# Patient Record
Sex: Male | Born: 1960 | Race: White | Hispanic: No
Health system: Southern US, Community
[De-identification: ages and names within clinical notes are randomized; demographics above are authoritative.]

## PROBLEM LIST (undated history)

## (undated) DIAGNOSIS — G4700 Insomnia, unspecified: Secondary | ICD-10-CM

## (undated) DIAGNOSIS — F32A Depression, unspecified: Secondary | ICD-10-CM

## (undated) DIAGNOSIS — M1990 Unspecified osteoarthritis, unspecified site: Secondary | ICD-10-CM

## (undated) DIAGNOSIS — E119 Type 2 diabetes mellitus without complications: Secondary | ICD-10-CM

## (undated) DIAGNOSIS — K219 Gastro-esophageal reflux disease without esophagitis: Secondary | ICD-10-CM

## (undated) DIAGNOSIS — F259 Schizoaffective disorder, unspecified: Secondary | ICD-10-CM

## (undated) DIAGNOSIS — I8393 Asymptomatic varicose veins of bilateral lower extremities: Secondary | ICD-10-CM

## (undated) DIAGNOSIS — R471 Dysarthria and anarthria: Secondary | ICD-10-CM

## (undated) DIAGNOSIS — M545 Low back pain, unspecified: Secondary | ICD-10-CM

## (undated) DIAGNOSIS — M549 Dorsalgia, unspecified: Secondary | ICD-10-CM

## (undated) DIAGNOSIS — K429 Umbilical hernia without obstruction or gangrene: Secondary | ICD-10-CM

## (undated) DIAGNOSIS — F319 Bipolar disorder, unspecified: Secondary | ICD-10-CM

## (undated) HISTORY — DX: Asymptomatic varicose veins of bilateral lower extremities: I83.93

## (undated) HISTORY — DX: Insomnia, unspecified: G47.00

## (undated) HISTORY — PX: WRIST SURGERY: SHX841

## (undated) HISTORY — DX: Type 2 diabetes mellitus without complications: E11.9

## (undated) HISTORY — DX: Dorsalgia, unspecified: M54.9

## (undated) HISTORY — PX: ANKLE SURGERY: SHX546

## (undated) HISTORY — DX: Gastro-esophageal reflux disease without esophagitis: K21.9

## (undated) HISTORY — DX: Unspecified osteoarthritis, unspecified site: M19.90

## (undated) HISTORY — PX: ABDOMINAL SURGERY: SHX537

## (undated) HISTORY — DX: Bipolar disorder, unspecified: F31.9

## (undated) HISTORY — DX: Depression, unspecified: F32.A

## (undated) HISTORY — DX: Dysarthria and anarthria: R47.1

## (undated) HISTORY — DX: Schizoaffective disorder, unspecified: F25.9

## (undated) HISTORY — DX: Umbilical hernia without obstruction or gangrene: K42.9

## (undated) HISTORY — DX: Low back pain, unspecified: M54.50

---

## 2020-08-04 ENCOUNTER — Telehealth: Payer: Self-pay

## 2020-08-04 NOTE — Telephone Encounter (Signed)
Pt scheduled for NP appt w/ Dr. Orvan Falconer 08/08/20. Records received from [facility] incomplete. Appears records received were as follows:   Order for FOBT and GI referral  POC results for FOBT = POS  05/11/20 CBC and incomplete BMP  Called [facility] and spoke with nurse caring for pt. Advised additional records will be required as listed below:   Current MAR  Updated and complete labs relevant to appt  Imaging if completed   Notes relevant to appt  Misc records nurse feels would be relevant to appt  Provided fax number. States pt has been having ongoing weight loss unrelated to failure to thrive. States she will ensure we have records needed for appt to address pt ongoing medical concerns.

## 2020-08-08 ENCOUNTER — Encounter: Payer: Self-pay | Admitting: Gastroenterology

## 2020-08-08 ENCOUNTER — Ambulatory Visit (INDEPENDENT_AMBULATORY_CARE_PROVIDER_SITE_OTHER): Payer: Medicaid Other | Admitting: Gastroenterology

## 2020-08-08 VITALS — BP 110/70 | HR 60 | Ht 66.54 in | Wt 185.1 lb

## 2020-08-08 DIAGNOSIS — R634 Abnormal weight loss: Secondary | ICD-10-CM

## 2020-08-08 DIAGNOSIS — K921 Melena: Secondary | ICD-10-CM | POA: Diagnosis not present

## 2020-08-08 NOTE — Progress Notes (Signed)
Referring Provider: Galvin Proffer, MD Primary Care Physician:  Galvin Proffer, MD  Reason for Consultation:  Blood in the stool   IMPRESSION:  Hemoccult positive stools Normocytic anemia Unintentional weight loss of 100 pounds Reflux  Endoscopy evaluation recommended to evaluate for GI blood loss anemia without overt bleeding or localizing symptoms. He is currently refusing any endoscopic evaluation. We discussed the risks of missed or undiagnosed lesions.  PLAN: EGD and colonoscopy can be scheduled at his convenience  Please see the "Patient Instructions" section for addition details about the plan.  HPI: Connor Franklin is a 59 y.o. male referred by Dr. Farrel Conners for further evaluation of Hemoccult positive stools and a 100 pound weight loss over the last year. The history is obtained through the patient and review of his electronic health record. He has dysarthria, GERD, major depressive disorder, type 2 diabetes, insomnia, schizophrenia, arthritis.   Referral records note Hemoccult positive stools 05/12/2020, CBC showed a hemoglobin of 11.4, MCV 97, RDW 12.1, platelets 414  Smokes 3 cigarettes daily. No alcohol. No street drugs.   Occasional diarrhea. Possible food triggers. GI ROS is otherwise negative.   Prior colonoscopy at [facility] at age 4 with removal of "benign" polyps.   No known family history of colon cancer or polyps. No family history of uterine/endometrial cancer, pancreatic cancer or gastric/stomach cancer.   Past Medical History:  Diagnosis Date  . Arthritis   . Bipolar disorder (HCC)   . Depression   . DM (diabetes mellitus) (HCC)   . Dorsalgia   . Dysarthria and anarthria   . GERD (gastroesophageal reflux disease)   . Hernia, umbilical   . Insomnia   . Low back pain   . Schizoaffective disorder (HCC)   . Varicose veins of both lower extremities     Past Surgical History:  Procedure Laterality Date  . ABDOMINAL SURGERY    . ANKLE  SURGERY Right   . WRIST SURGERY Right     Current Outpatient Medications  Medication Sig Dispense Refill  . acetaminophen (TYLENOL) 500 MG tablet Take 1,000 mg by mouth in the morning, at noon, and at bedtime.    . carvedilol (COREG) 12.5 MG tablet Take 12.5 mg by mouth 2 (two) times daily with a meal.    . clonazePAM (KLONOPIN) 1 MG tablet Take 1 mg by mouth at bedtime.    . divalproex (DEPAKOTE) 250 MG DR tablet Take 750 mg by mouth at bedtime.    . docusate sodium (COLACE) 100 MG capsule Take 200 mg by mouth at bedtime.    . finasteride (PROSCAR) 5 MG tablet Take 5 mg by mouth daily.    . haloperidol (HALDOL) 1 MG tablet Take 3 mg by mouth 2 (two) times daily. T 2 pm and at bedtime    . lactulose (CHRONULAC) 10 GM/15ML solution Take 20 g by mouth daily.    . melatonin 5 MG TABS Take 5 mg by mouth at bedtime.    . nitroGLYCERIN (NITROSTAT) 0.4 MG SL tablet Place 0.4 mg under the tongue every 5 (five) minutes as needed for chest pain.    Marland Kitchen risperiDONE (RISPERDAL) 2 MG tablet Take 2 mg by mouth in the morning.    . sodium chloride 1 g tablet Take 1 g by mouth 4 (four) times daily.    . tamsulosin (FLOMAX) 0.4 MG CAPS capsule Take 0.4 mg by mouth in the morning.    . traZODone (DESYREL) 150 MG tablet Take 150  mg by mouth at bedtime.     No current facility-administered medications for this visit.    Allergies as of 08/08/2020 - Review Complete 08/08/2020  Allergen Reaction Noted  . Elavil [amitriptyline hcl]  08/08/2020  . Sulfa antibiotics  08/08/2020    Family History  Family history unknown: Yes    Social History   Socioeconomic History  . Marital status: Unknown    Spouse name: Not on file  . Number of children: 0  . Years of education: Not on file  . Highest education level: Not on file  Occupational History  . Occupation: disabled  Tobacco Use  . Smoking status: Current Every Day Smoker  . Smokeless tobacco: Former Neurosurgeon    Types: Engineer, drilling  . Vaping Use:  Never used  Substance and Sexual Activity  . Alcohol use: Not Currently  . Drug use: Never  . Sexual activity: Not on file  Other Topics Concern  . Not on file  Social History Narrative  . Not on file   Social Determinants of Health   Financial Resource Strain: Not on file  Food Insecurity: Not on file  Transportation Needs: Not on file  Physical Activity: Not on file  Stress: Not on file  Social Connections: Not on file  Intimate Partner Violence: Not on file    Review of Systems: 12 system ROS is negative except as noted above. "He mentioned that he just wants to die."   Physical Exam: General:   Alert,  well-nourished, pleasant and cooperative in NAD Head:  Normocephalic and atraumatic. Eyes:  Sclera clear, no icterus.   Conjunctiva pink. Ears:  Normal auditory acuity. Nose:  No deformity, discharge,  or lesions. Mouth:  No deformity or lesions.   Neck:  Supple; no masses or thyromegaly. Lungs:  Clear throughout to auscultation.   No wheezes. Heart:  Regular rate and rhythm; no murmurs. Abdomen:  Soft, large midline incision with a reducible umbilical hernia, extensive panus, nontender, nondistended, normal bowel sounds, no rebound or guarding. No hepatosplenomegaly.   Rectal:  Deferred  Msk:  Symmetrical. No boney deformities LAD: No inguinal or umbilical LAD Extremities:  No clubbing or edema. Neurologic:  Alert and  oriented x4;  grossly nonfocal Skin:  Intact without significant lesions or rashes. Psych:  Alert and cooperative. Normal mood and affect.    Yavonne Kiss L. Orvan Falconer, MD, MPH 08/12/2020, 9:26 PM

## 2020-08-08 NOTE — Patient Instructions (Addendum)
PROCEDURE:  It has been recommended to you by your physician that you have an Endoscopy and Colonoscopy completed. Per your request, we did not schedule the procedure(s) today. Please contact our office at [phone] should you decide to have the procedure completed. You will be scheduled for a pre-visit and procedure at that time.  I have recommended a colonoscopy and upper endoscopy to further evaluate your unintentional weight loss, blood in the stool, and anemia (low blood counts). I know you don't want to have the procedures done now. But, I hope you will reconsider. Please call me at any time to schedule the procedures.   If you are age 61 or younger, your body mass index should be between 19-25. Your There is no height or weight on file to calculate BMI. If this is out of the aformentioned range listed, please consider follow up with your Primary Care Provider.   Thank you for trusting me with your gastrointestinal care!    Tressia Danas, MD, MPH

## 2020-09-02 ENCOUNTER — Emergency Department (HOSPITAL_COMMUNITY): Payer: Medicaid Other

## 2020-09-02 ENCOUNTER — Encounter (HOSPITAL_COMMUNITY): Payer: Self-pay | Admitting: Emergency Medicine

## 2020-09-02 ENCOUNTER — Other Ambulatory Visit: Payer: Self-pay

## 2020-09-02 ENCOUNTER — Inpatient Hospital Stay (HOSPITAL_COMMUNITY)
Admission: EM | Admit: 2020-09-02 | Discharge: 2020-09-08 | DRG: 641 | Disposition: A | Discharge status: Skilled Nursing Facility | Payer: Medicaid Other | Source: Skilled Nursing Facility | Attending: Internal Medicine | Admitting: Internal Medicine

## 2020-09-02 DIAGNOSIS — E871 Hypo-osmolality and hyponatremia: Secondary | ICD-10-CM | POA: Diagnosis not present

## 2020-09-02 DIAGNOSIS — F32A Depression, unspecified: Secondary | ICD-10-CM | POA: Diagnosis present

## 2020-09-02 DIAGNOSIS — F17210 Nicotine dependence, cigarettes, uncomplicated: Secondary | ICD-10-CM | POA: Diagnosis present

## 2020-09-02 DIAGNOSIS — Z20822 Contact with and (suspected) exposure to covid-19: Secondary | ICD-10-CM | POA: Diagnosis present

## 2020-09-02 DIAGNOSIS — Z882 Allergy status to sulfonamides status: Secondary | ICD-10-CM

## 2020-09-02 DIAGNOSIS — R0602 Shortness of breath: Secondary | ICD-10-CM

## 2020-09-02 DIAGNOSIS — I10 Essential (primary) hypertension: Secondary | ICD-10-CM | POA: Diagnosis present

## 2020-09-02 DIAGNOSIS — E119 Type 2 diabetes mellitus without complications: Secondary | ICD-10-CM | POA: Diagnosis present

## 2020-09-02 DIAGNOSIS — Z79899 Other long term (current) drug therapy: Secondary | ICD-10-CM

## 2020-09-02 DIAGNOSIS — E869 Volume depletion, unspecified: Secondary | ICD-10-CM | POA: Diagnosis present

## 2020-09-02 DIAGNOSIS — Z66 Do not resuscitate: Secondary | ICD-10-CM | POA: Diagnosis present

## 2020-09-02 DIAGNOSIS — R631 Polydipsia: Secondary | ICD-10-CM | POA: Diagnosis present

## 2020-09-02 DIAGNOSIS — N400 Enlarged prostate without lower urinary tract symptoms: Secondary | ICD-10-CM | POA: Diagnosis present

## 2020-09-02 DIAGNOSIS — G4700 Insomnia, unspecified: Secondary | ICD-10-CM | POA: Diagnosis present

## 2020-09-02 DIAGNOSIS — E2749 Other adrenocortical insufficiency: Secondary | ICD-10-CM | POA: Diagnosis present

## 2020-09-02 DIAGNOSIS — K219 Gastro-esophageal reflux disease without esophagitis: Secondary | ICD-10-CM | POA: Diagnosis present

## 2020-09-02 DIAGNOSIS — F259 Schizoaffective disorder, unspecified: Secondary | ICD-10-CM | POA: Clinically undetermined

## 2020-09-02 DIAGNOSIS — Z888 Allergy status to other drugs, medicaments and biological substances status: Secondary | ICD-10-CM

## 2020-09-02 LAB — RAPID URINE DRUG SCREEN, HOSP PERFORMED
Amphetamines: NOT DETECTED
Barbiturates: NOT DETECTED
Benzodiazepines: NOT DETECTED
Cocaine: NOT DETECTED
Opiates: NOT DETECTED
Tetrahydrocannabinol: NOT DETECTED

## 2020-09-02 LAB — URINALYSIS, ROUTINE W REFLEX MICROSCOPIC
Bacteria, UA: NONE SEEN
Bilirubin Urine: NEGATIVE
Glucose, UA: 50 mg/dL — AB
Ketones, ur: NEGATIVE mg/dL
Leukocytes,Ua: NEGATIVE
Nitrite: NEGATIVE
Protein, ur: NEGATIVE mg/dL
Specific Gravity, Urine: 1.002 — ABNORMAL LOW (ref 1.005–1.030)
pH: 7 (ref 5.0–8.0)

## 2020-09-02 LAB — CBC WITH DIFFERENTIAL/PLATELET
Abs Immature Granulocytes: 0.08 10*3/uL — ABNORMAL HIGH (ref 0.00–0.07)
Basophils Absolute: 0.1 10*3/uL (ref 0.0–0.1)
Basophils Relative: 1 %
Eosinophils Absolute: 0.1 10*3/uL (ref 0.0–0.5)
Eosinophils Relative: 1 %
HCT: 32.3 % — ABNORMAL LOW (ref 39.0–52.0)
Hemoglobin: 11.5 g/dL — ABNORMAL LOW (ref 13.0–17.0)
Immature Granulocytes: 1 %
Lymphocytes Relative: 23 %
Lymphs Abs: 2 10*3/uL (ref 0.7–4.0)
MCH: 34.6 pg — ABNORMAL HIGH (ref 26.0–34.0)
MCHC: 35.6 g/dL (ref 30.0–36.0)
MCV: 97.3 fL (ref 80.0–100.0)
Monocytes Absolute: 0.8 10*3/uL (ref 0.1–1.0)
Monocytes Relative: 9 %
Neutro Abs: 5.7 10*3/uL (ref 1.7–7.7)
Neutrophils Relative %: 65 %
Platelets: 387 10*3/uL (ref 150–400)
RBC: 3.32 MIL/uL — ABNORMAL LOW (ref 4.22–5.81)
RDW: 13.5 % (ref 11.5–15.5)
WBC: 8.8 10*3/uL (ref 4.0–10.5)
nRBC: 0 % (ref 0.0–0.2)

## 2020-09-02 LAB — COMPREHENSIVE METABOLIC PANEL
ALT: 18 U/L (ref 0–44)
AST: 25 U/L (ref 15–41)
Albumin: 4 g/dL (ref 3.5–5.0)
Alkaline Phosphatase: 90 U/L (ref 38–126)
Anion gap: 10 (ref 5–15)
BUN: 11 mg/dL (ref 6–20)
CO2: 23 mmol/L (ref 22–32)
Calcium: 9.9 mg/dL (ref 8.9–10.3)
Chloride: 80 mmol/L — ABNORMAL LOW (ref 98–111)
Creatinine, Ser: 0.92 mg/dL (ref 0.61–1.24)
GFR, Estimated: >60 mL/min (ref >60)
Glucose, Bld: 113 mg/dL — ABNORMAL HIGH (ref 70–99)
Potassium: 4.1 mmol/L (ref 3.5–5.1)
Sodium: 113 mmol/L — CL (ref 135–145)
Total Bilirubin: 1.1 mg/dL (ref 0.3–1.2)
Total Protein: 7 g/dL (ref 6.5–8.1)

## 2020-09-02 LAB — I-STAT CHEM 8, ED
BUN: 12 mg/dL (ref 6–20)
Calcium, Ion: 1.29 mmol/L (ref 1.15–1.40)
Chloride: 79 mmol/L — ABNORMAL LOW (ref 98–111)
Creatinine, Ser: 0.9 mg/dL (ref 0.61–1.24)
Glucose, Bld: 108 mg/dL — ABNORMAL HIGH (ref 70–99)
HCT: 37 % — ABNORMAL LOW (ref 39.0–52.0)
Hemoglobin: 12.6 g/dL — ABNORMAL LOW (ref 13.0–17.0)
Potassium: 4.3 mmol/L (ref 3.5–5.1)
Sodium: 112 mmol/L — CL (ref 135–145)
TCO2: 24 mmol/L (ref 22–32)

## 2020-09-02 LAB — RESP PANEL BY RT-PCR (FLU A&B, COVID) ARPGX2
Influenza A by PCR: NEGATIVE
Influenza B by PCR: NEGATIVE
SARS Coronavirus 2 by RT PCR: NEGATIVE

## 2020-09-02 LAB — TSH: TSH: 0.723 u[IU]/mL (ref 0.350–4.500)

## 2020-09-02 LAB — CREATININE, URINE, RANDOM: Creatinine, Urine: 8.55 mg/dL

## 2020-09-02 LAB — PROTIME-INR
INR: 1.1 (ref 0.8–1.2)
Prothrombin Time: 14 seconds (ref 11.4–15.2)

## 2020-09-02 LAB — SODIUM, URINE, RANDOM: Sodium, Ur: 40 mmol/L

## 2020-09-02 LAB — AMMONIA: Ammonia: 15 umol/L (ref 9–35)

## 2020-09-02 LAB — ETHANOL: Alcohol, Ethyl (B): <10 mg/dL (ref <10)

## 2020-09-02 LAB — CBG MONITORING, ED: Glucose-Capillary: 109 mg/dL — ABNORMAL HIGH (ref 70–99)

## 2020-09-02 LAB — VALPROIC ACID LEVEL: Valproic Acid Lvl: <10 ug/mL — ABNORMAL LOW (ref 50.0–100.0)

## 2020-09-02 MED ORDER — SODIUM CHLORIDE 0.9 % IV SOLN: Freq: Once | INTRAVENOUS | Status: AC

## 2020-09-02 MED ORDER — HALOPERIDOL LACTATE 5 MG/ML IJ SOLN
5.0000 mg | Freq: Once | INTRAMUSCULAR | Status: AC
  Administered 2020-09-02: 5 mg via INTRAVENOUS
  Filled 2020-09-02: qty 1

## 2020-09-02 NOTE — H&P (Signed)
NAME:  Connor Franklin, MRN:  [id number], DOB:  1960/12/21, LOS: 0 ADMISSION DATE:  09/02/2020, CONSULTATION DATE:  09/03/19 REFERRING MD: Criss Alvine, ED   , CHIEF COMPLAINT:  Altered mental satus   Brief History:  Agitation/ams  History of Present Illness:  Connor Franklin is a 60 yo man with a hx of bipolar disorder,  Schizoaffective disorder, depression, DM, GERD, sent to [facility] ED from [facility] with altered mental status.  He was reportedly altered today, running into walls, throwing self on ground, aggressive and agitated with staff.   Found to be hyponatremic to 112 in the ED today.   He was recently discharged from [facility] 2 weeks ago, and according to staff at his SNF, we was taken off his psychiatric meds at that time.   He was reportedly altered/agitated as well on 1/7 and seen at [facility] ED. By verbal report from staff, NA at hospital  was 114.  Possible history of hyponatremia in past, some verbal report that he had been taking salt tabs.  Review of recent hx shows sodium generally in 110s back to mid December, 120s-130s prior to that.    Past Medical History:  bipolar disorder,  Schizoaffective disorder, depression, DM, GERD, dyarthria  Home meds:  Coreg, lactulose, klonopin, depakote, risperdal, haldol, trazodone, flomax, proscar. melatonin, NTG  Significant Hospital Events:    Consults:    Procedures:    Significant Diagnostic Tests:  CT head IMPRESSION: 1. No acute intracranial abnormality. 2. Age related atrophy and chronic small vessel ischemia. Small remote left occipital infarct.  Micro Data:    Antimicrobials:    Interim History / Subjective:    Objective   Blood pressure (!) 162/93, pulse 73, temperature 97.9 F (36.6 C), temperature source Oral, resp. rate 15, SpO2 100 %.       No intake or output data in the 24 hours ending 09/02/20 2130 There were no vitals filed for this visit.  Examination: General: NAD,  sleeping, arousable, one word answers, tracks and makes eye contact HENT: Scar on chin.  MMM.  Adentulous Lungs: CTAB Cardiovascular: RRR no mgr Abdomen: NT, ND,NBS Extremities: eccymosis/chronic venous changes on BLE Neuro: Awake, sleepy.  Just received haldol.  Following some commands.  GU:   Resolved Hospital Problem list     Assessment & Plan:  Altered mental status: workup negative thus far aside from hyponatremia, which is mostly chronic.   Possibly due to recently stopping psychiatry medications?   Possibly 2/2 acute on chronic hyponatremia.  Exam appears non focal to me.  Ammonia negative.  B12 pending (hx of deficiency).   Hyponatremia:  Appears euvolemic.   U NA is 40.  FENA 3% Was given 75cc NS over one hour.  Was incontinent of urine initially, then 2L of dilute appearing urine after condom cath was placed.   Large volume urine suggests psychogenic polydipsia, perhaps complicated by low solute intake?  Consider renal losses, however no renal failure, no acidosis.  Mineralocorticoid deficiency.   Urine osm pending.  Discussed with nephrology, Dr. Valentino Nose.  Recheck Na q 2 hrs.  Hold further NS.   If corrects too fast on next check, will give DDAVP and free water.    Best practice (evaluated daily)  Diet: NPO for now Pain/Anxiety/Delirium protocol (if indicated): na  VAP protocol (if indicated):  DVT prophylaxis: lovenox GI prophylaxis:  Glucose control: iss  Mobility: bed Disposition:ICU  Goals of Care:  Last date of multidisciplinary goals of care  discussion:  Family and staff present:  Summary of discussion:  Follow up goals of care discussion due:  Code Status: DNR/DNI Patient unable to make decisions now but has a signed DNR/DNI form from 05/2020, signed by the patient and physician.  Discussed admission with his mother, Connor Franklin.  She is aware of his code status.  She had been visiting him earlier today when he was agitated and altered, and she was aware  that he was in the ED>   Labs   CBC: Recent Labs  Lab 09/02/20 2045 09/02/20 2058  WBC 8.8  --   NEUTROABS 5.7  --   HGB 11.5* 12.6*  HCT 32.3* 37.0*  MCV 97.3  --   PLT 387  --     Basic Metabolic Panel: Recent Labs  Lab 09/02/20 2058  NA 112*  K 4.3  CL 79*  GLUCOSE 108*  BUN 12  CREATININE 0.90   GFR: CrCl cannot be calculated (Unknown ideal weight.). Recent Labs  Lab 09/02/20 2045  WBC 8.8    Liver Function Tests: No results for input(s): AST, ALT, ALKPHOS, BILITOT, PROT, ALBUMIN in the last 168 hours. No results for input(s): LIPASE, AMYLASE in the last 168 hours. No results for input(s): AMMONIA in the last 168 hours.  ABG    Component Value Date/Time   TCO2 24 09/02/2020 2058     Coagulation Profile: No results for input(s): INR, PROTIME in the last 168 hours.  Cardiac Enzymes: No results for input(s): CKTOTAL, CKMB, CKMBINDEX, TROPONINI in the last 168 hours.  HbA1C: No results found for: HGBA1C  CBG: Recent Labs  Lab 09/02/20 2023  GLUCAP 109*    Review of Systems:   Unable to assess  Past Medical History:  He,  has a past medical history of Arthritis, Bipolar disorder (HCC), Depression, DM (diabetes mellitus) (HCC), Dorsalgia, Dysarthria and anarthria, GERD (gastroesophageal reflux disease), Hernia, umbilical, Insomnia, Low back pain, Schizoaffective disorder (HCC), and Varicose veins of both lower extremities.   Surgical History:   Past Surgical History:  Procedure Laterality Date  . ABDOMINAL SURGERY    . ANKLE SURGERY Right   . WRIST SURGERY Right      Social History:   reports that he has been smoking. He has quit using smokeless tobacco.  His smokeless tobacco use included chew. He reports previous alcohol use. He reports that he does not use drugs.   Family History:  His Family history is unknown by patient.   Allergies Allergies  Allergen Reactions  . Elavil [Amitriptyline Hcl]   . Sulfa Antibiotics      Home  Medications  Prior to Admission medications   Medication Sig Start Date End Date Taking? Authorizing Provider  acetaminophen (TYLENOL) 500 MG tablet Take 1,000 mg by mouth in the morning, at noon, and at bedtime.    [provider]  carvedilol (COREG) 12.5 MG tablet Take 12.5 mg by mouth 2 (two) times daily with a meal.    [provider]  clonazePAM (KLONOPIN) 1 MG tablet Take 1 mg by mouth at bedtime.    [provider]  divalproex (DEPAKOTE) 250 MG DR tablet Take 750 mg by mouth at bedtime.    [provider]  docusate sodium (COLACE) 100 MG capsule Take 200 mg by mouth at bedtime.    [provider]  finasteride (PROSCAR) 5 MG tablet Take 5 mg by mouth daily.    [provider]  haloperidol (HALDOL) 1 MG tablet Take 3 mg  by mouth 2 (two) times daily. T 2 pm and at bedtime    [provider]  lactulose (CHRONULAC) 10 GM/15ML solution Take 20 g by mouth daily.    [provider]  melatonin 5 MG TABS Take 5 mg by mouth at bedtime.    [provider]  nitroGLYCERIN (NITROSTAT) 0.4 MG SL tablet Place 0.4 mg under the tongue every 5 (five) minutes as needed for chest pain.    [provider]  risperiDONE (RISPERDAL) 2 MG tablet Take 2 mg by mouth in the morning.    [provider]  sodium chloride 1 g tablet Take 1 g by mouth 4 (four) times daily.    [provider]  tamsulosin (FLOMAX) 0.4 MG CAPS capsule Take 0.4 mg by mouth in the morning.    [provider]  traZODone (DESYREL) 150 MG tablet Take 150 mg by mouth at bedtime.    [provider]     Critical care time: 

## 2020-09-02 NOTE — ED Provider Notes (Signed)
[facility] Provider Note   CSN: [id number] Arrival date & time: 09/02/20  1945  LEVEL 5 CAVEAT - ALTERED MENTAL STATUS  History No chief complaint on file.   Connor Franklin is a 60 y.o. male.  HPI 60 year old male presents with altered mental status. History is from EMS and [facility].  Patient has reportedly been off his psychiatric meds for about 2 weeks.  Staff tells me that this is because when he was discharged from [facility] 2 weeks ago they took him off.  Patient was altered and agitated yesterday and went to [facility].  He was sent back to the facility.  He has a history of low sodiums before and the tech tells me that his sodium yesterday at the hospital was 114.  The patient has not had any fevers.  Today he was altered again and running himself into walls and throwing himself on the ground.  Was naked.  He also was becoming aggressive and attacking staff which is why EMS was called again.  Further history is very limited as the patient is altered.   Past Medical History:  Diagnosis Date   Arthritis    Bipolar disorder (HCC)    Depression    DM (diabetes mellitus) (HCC)    Dorsalgia    Dysarthria and anarthria    GERD (gastroesophageal reflux disease)    Hernia, umbilical    Insomnia    Low back pain    Schizoaffective disorder (HCC)    Varicose veins of both lower extremities     There are no problems to display for this patient.   Past Surgical History:  Procedure Laterality Date   ABDOMINAL SURGERY     ANKLE SURGERY Right    WRIST SURGERY Right        Family History  Family history unknown: Yes    Social History   Tobacco Use   Smoking status: Current Every Day Smoker   Smokeless tobacco: Former Neurosurgeon    Types: Associate Professor Use: Never used  Substance Use Topics   Alcohol use: Not Currently   Drug use: Never    Home Medications Prior to Admission  medications   Medication Sig Start Date End Date Taking? Authorizing Provider  acetaminophen (TYLENOL) 500 MG tablet Take 1,000 mg by mouth in the morning, at noon, and at bedtime.    [provider]  carvedilol (COREG) 12.5 MG tablet Take 12.5 mg by mouth 2 (two) times daily with a meal.    [provider]  clonazePAM (KLONOPIN) 1 MG tablet Take 1 mg by mouth at bedtime.    [provider]  divalproex (DEPAKOTE) 250 MG DR tablet Take 750 mg by mouth at bedtime.    [provider]  docusate sodium (COLACE) 100 MG capsule Take 200 mg by mouth at bedtime.    [provider]  finasteride (PROSCAR) 5 MG tablet Take 5 mg by mouth daily.    [provider]  haloperidol (HALDOL) 1 MG tablet Take 3 mg by mouth 2 (two) times daily. T 2 pm and at bedtime    [provider]  lactulose (CHRONULAC) 10 GM/15ML solution Take 20 g by mouth daily.    [provider]  melatonin 5 MG TABS Take 5 mg by mouth at bedtime.    [provider]  nitroGLYCERIN (NITROSTAT) 0.4 MG SL tablet Place 0.4 mg under the tongue every 5 (  five) minutes as needed for chest pain.    [provider]  risperiDONE (RISPERDAL) 2 MG tablet Take 2 mg by mouth in the morning.    [provider]  sodium chloride 1 g tablet Take 1 g by mouth 4 (four) times daily.    [provider]  tamsulosin (FLOMAX) 0.4 MG CAPS capsule Take 0.4 mg by mouth in the morning.    [provider]  traZODone (DESYREL) 150 MG tablet Take 150 mg by mouth at bedtime.    [provider]    Allergies    Elavil [amitriptyline hcl] and Sulfa antibiotics  Review of Systems   Review of Systems  Unable to perform ROS: Mental status change    Physical Exam Updated Vital Signs BP (!) 142/92    Pulse 82    Temp 97.9 F (36.6 C) (Oral)    Resp 16    SpO2 100%   Physical Exam Vitals and nursing note reviewed.  Constitutional:      Appearance:  He is well-developed and well-nourished.  HENT:     Head: Normocephalic and atraumatic.     Right Ear: External ear normal.     Left Ear: External ear normal.     Nose: Nose normal.     Mouth/Throat:     Mouth: Mucous membranes are dry.  Eyes:     General:        Right eye: No discharge.        Left eye: No discharge.     Pupils: Pupils are equal, round, and reactive to light.  Cardiovascular:     Rate and Rhythm: Normal rate and regular rhythm.     Heart sounds: Normal heart sounds.  Pulmonary:     Effort: Pulmonary effort is normal.     Breath sounds: Normal breath sounds.  Abdominal:     Palpations: Abdomen is soft.     Tenderness: There is no abdominal tenderness.     Hernia: A hernia (non-tender. no incarceration) is present.  Musculoskeletal:        General: No edema.     Cervical back: Neck supple.  Skin:    General: Skin is warm and dry.  Neurological:     Mental Status: He is alert.     Comments: Awake, alert. Thinks he's at [employer]. Other orientation questions aren't answered. He has equal strength in all 4 extremities  Psychiatric:        Mood and Affect: Mood is not anxious.     ED Results / Procedures / Treatments   Labs (all labs ordered are listed, but only abnormal results are displayed) Labs Reviewed  COMPREHENSIVE METABOLIC PANEL - Abnormal; Notable for the following components:      Result Value   Sodium 113 (*)    Chloride 80 (*)    Glucose, Bld 113 (*)    All other components within normal limits  CBC WITH DIFFERENTIAL/PLATELET - Abnormal; Notable for the following components:   RBC 3.32 (*)    Hemoglobin 11.5 (*)    HCT 32.3 (*)    MCH 34.6 (*)    Abs Immature Granulocytes 0.08 (*)    All other components within normal limits  URINALYSIS, ROUTINE W REFLEX MICROSCOPIC - Abnormal; Notable for the following components:   Color, Urine COLORLESS (*)    Specific Gravity, Urine 1.002 (*)    Glucose, UA 50 (*)    Hgb urine dipstick SMALL (*)     All other  components within normal limits  VALPROIC ACID LEVEL - Abnormal; Notable for the following components:   Valproic Acid Lvl <10 (*)    All other components within normal limits  CBG MONITORING, ED - Abnormal; Notable for the following components:   Glucose-Capillary 109 (*)    All other components within normal limits  I-STAT CHEM 8, ED - Abnormal; Notable for the following components:   Sodium 112 (*)    Chloride 79 (*)    Glucose, Bld 108 (*)    Hemoglobin 12.6 (*)    HCT 37.0 (*)    All other components within normal limits  RESP PANEL BY RT-PCR (FLU A&B, COVID) ARPGX2  ETHANOL  RAPID URINE DRUG SCREEN, HOSP PERFORMED  AMMONIA  PROTIME-INR  OSMOLALITY  OSMOLALITY, URINE  SODIUM, URINE, RANDOM  CORTISOL  TSH  CREATININE, URINE, RANDOM  VITAMIN B12  METHYLMALONIC ACID(MMA), RND URINE  BASIC METABOLIC PANEL  BASIC METABOLIC PANEL  BASIC METABOLIC PANEL  BASIC METABOLIC PANEL  BASIC METABOLIC PANEL    EKG EKG Interpretation  Date/Time:  Saturday September 02 2020 20:35:21 EST Ventricular Rate:  68 PR Interval:    QRS Duration: 111 QT Interval:  424 QTC Calculation: 451 R Axis:   62 Text Interpretation: Sinus rhythm Borderline prolonged PR interval Inferior infarct, old Interpretation limited secondary to artifact No old tracing to compare Confirmed by Pricilla Loveless [phone]) on 09/02/2020 8:43:25 PM   Radiology CT Head Wo Contrast  Result Date: 09/02/2020 CLINICAL DATA:  Altered mental status. EXAM: CT HEAD WITHOUT CONTRAST TECHNIQUE: Contiguous axial images were obtained from the base of the skull through the vertex without intravenous contrast. COMPARISON:  Head CT 05/09/2012 FINDINGS: Brain: Age related atrophy. There is mild periventricular and deep white matter hypodensity, nonspecific but typical of chronic small vessel ischemia. No intracranial hemorrhage, mass effect, or midline shift. No hydrocephalus. The basilar cisterns are patent. 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 No evidence of territorial infarct or acute ischemia. No extra-axial or intracranial fluid collection. Vascular: No hyperdense vessel.  Skull base atherosclerosis. Skull: No fracture or focal lesion. Sinuses/Orbits: Mucous retention cysts in the left maxillary sinus. No acute findings. Mastoid air cells are clear. Other: None. IMPRESSION: 1. No acute intracranial abnormality. 2. Age related atrophy and chronic small vessel ischemia. Small remote left occipital infarct. Electronically Signed   By: Narda Rutherford M.D.   On: 09/02/2020 21:34   DG CHEST PORT 1 VIEW  Result Date: 09/02/2020 CLINICAL DATA:  Hyponatremia. EXAM: PORTABLE CHEST 1 VIEW COMPARISON:  06/02/2000 FINDINGS: The heart size and mediastinal contours are within normal limits. Both lungs are clear. The visualized skeletal structures are unremarkable. IMPRESSION: No active disease. Electronically Signed   By: Katherine Mantle M.D.   On: 09/02/2020 22:16    Procedures .Critical Care Performed by: Pricilla Loveless, MD Authorized by: Pricilla Loveless, MD   Critical care provider statement:    Critical care time (minutes):  50   Critical care time was exclusive of:  Separately billable procedures and treating other patients   Critical care was necessary to treat or prevent imminent or life-threatening deterioration of the following conditions:  CNS failure or compromise and metabolic crisis   Critical care was time spent personally by me on the following activities:  Discussions with consultants, evaluation of patient's response to treatment, examination of patient, ordering and performing treatments and interventions, ordering and review of laboratory studies, ordering and review of radiographic studies, pulse oximetry, re-evaluation of patient's condition,  obtaining history from patient or surrogate and review of old charts   (including critical care time)  Medications Ordered in  ED Medications  0.9 %  sodium chloride infusion ( Intravenous New Bag/Given 09/02/20 2150)  haloperidol lactate (HALDOL) injection 5 mg (5 mg Intravenous Given 09/02/20 2212)    ED Course  I have reviewed the triage vital signs and the nursing notes.  Pertinent labs & imaging results that were available during my care of the patient were reviewed by me and considered in my medical decision making (see chart for details).  Clinical Course as of 09/02/20 2254  Sat Sep 02, 2020  2124 Dr Marcos EkeGonzales consulted given severe hyponatremia. Patient remains awake and alert though confused.  She recommends getting urine studies prior to any type of saline.  This includes osmolality and sodium.  Otherwise, for now would not do hypertonic saline but rather sodium chloride at 75 cc/h.  She will consult and admit. [SG]    Clinical Course User Index [SG] Pricilla LovelessGoldston, Draylon Mercadel, MD   MDM Rules/Calculators/A&P                          Patient's labs and CT/chest x-rays have been reviewed.  Severe hyponatremia.  Hard to tell how much of him is altered mental status versus psychosis and how much is from his psychiatric disease versus his sodium level.  For now we will do normal saline as above.  He is noted to have a fair amount of lightly colored urine coming out of his condom catheter.  We will send a stat BMP to assess where his sodium is now.  ICU to admit.  He is becoming a little more agitated to the point that he keeps trying to get out of bed.  He is unable to be redirected verbally so he was given IV Haldol. Final Clinical Impression(s) / ED Diagnoses Final diagnoses:  Hyponatremia    Rx / DC Orders ED Discharge Orders    None       Pricilla LovelessGoldston, Oliviya Gilkison, MD 09/02/20 2255

## 2020-09-02 NOTE — ED Triage Notes (Signed)
Per EMS, pt from [facility], presenting with altered mental status, Patient has been off of psychiatric for 2 weeks. Hx schizophrenia and bipolar.   178/11 BP 16 RR 100 HR 98 SpO2 121 CBG

## 2020-09-03 ENCOUNTER — Other Ambulatory Visit: Payer: Self-pay

## 2020-09-03 DIAGNOSIS — Z79899 Other long term (current) drug therapy: Secondary | ICD-10-CM | POA: Diagnosis not present

## 2020-09-03 DIAGNOSIS — F32A Depression, unspecified: Secondary | ICD-10-CM | POA: Diagnosis present

## 2020-09-03 DIAGNOSIS — G4700 Insomnia, unspecified: Secondary | ICD-10-CM | POA: Diagnosis present

## 2020-09-03 DIAGNOSIS — E869 Volume depletion, unspecified: Secondary | ICD-10-CM | POA: Diagnosis present

## 2020-09-03 DIAGNOSIS — E2749 Other adrenocortical insufficiency: Secondary | ICD-10-CM | POA: Diagnosis present

## 2020-09-03 DIAGNOSIS — Z882 Allergy status to sulfonamides status: Secondary | ICD-10-CM | POA: Diagnosis not present

## 2020-09-03 DIAGNOSIS — F17210 Nicotine dependence, cigarettes, uncomplicated: Secondary | ICD-10-CM | POA: Diagnosis present

## 2020-09-03 DIAGNOSIS — E871 Hypo-osmolality and hyponatremia: Principal | ICD-10-CM

## 2020-09-03 DIAGNOSIS — Z66 Do not resuscitate: Secondary | ICD-10-CM | POA: Diagnosis present

## 2020-09-03 DIAGNOSIS — Z20822 Contact with and (suspected) exposure to covid-19: Secondary | ICD-10-CM | POA: Diagnosis present

## 2020-09-03 DIAGNOSIS — E119 Type 2 diabetes mellitus without complications: Secondary | ICD-10-CM | POA: Diagnosis present

## 2020-09-03 DIAGNOSIS — R631 Polydipsia: Secondary | ICD-10-CM | POA: Diagnosis present

## 2020-09-03 DIAGNOSIS — N400 Enlarged prostate without lower urinary tract symptoms: Secondary | ICD-10-CM | POA: Diagnosis present

## 2020-09-03 DIAGNOSIS — Z888 Allergy status to other drugs, medicaments and biological substances status: Secondary | ICD-10-CM | POA: Diagnosis not present

## 2020-09-03 DIAGNOSIS — I10 Essential (primary) hypertension: Secondary | ICD-10-CM | POA: Diagnosis present

## 2020-09-03 DIAGNOSIS — F259 Schizoaffective disorder, unspecified: Secondary | ICD-10-CM | POA: Diagnosis not present

## 2020-09-03 DIAGNOSIS — K219 Gastro-esophageal reflux disease without esophagitis: Secondary | ICD-10-CM | POA: Diagnosis present

## 2020-09-03 LAB — HEMOGLOBIN A1C
Hgb A1c MFr Bld: 4.9 % (ref 4.8–5.6)
Mean Plasma Glucose: 93.93 mg/dL

## 2020-09-03 LAB — FOLATE: Folate: 8 ng/mL (ref >5.9)

## 2020-09-03 LAB — SODIUM: Sodium: 124 mmol/L — ABNORMAL LOW (ref 135–145)

## 2020-09-03 LAB — BASIC METABOLIC PANEL
Anion gap: 10 (ref 5–15)
Anion gap: 9 (ref 5–15)
Anion gap: 9 (ref 5–15)
Anion gap: 10 (ref 5–15)
BUN: 11 mg/dL (ref 6–20)
BUN: 10 mg/dL (ref 6–20)
BUN: 10 mg/dL (ref 6–20)
BUN: 11 mg/dL (ref 6–20)
CO2: 23 mmol/L (ref 22–32)
CO2: 25 mmol/L (ref 22–32)
CO2: 24 mmol/L (ref 22–32)
CO2: 23 mmol/L (ref 22–32)
Calcium: 9.9 mg/dL (ref 8.9–10.3)
Calcium: 9.9 mg/dL (ref 8.9–10.3)
Calcium: 9.8 mg/dL (ref 8.9–10.3)
Calcium: 10.3 mg/dL (ref 8.9–10.3)
Chloride: 84 mmol/L — ABNORMAL LOW (ref 98–111)
Chloride: 83 mmol/L — ABNORMAL LOW (ref 98–111)
Chloride: 88 mmol/L — ABNORMAL LOW (ref 98–111)
Chloride: 88 mmol/L — ABNORMAL LOW (ref 98–111)
Creatinine, Ser: 0.82 mg/dL (ref 0.61–1.24)
Creatinine, Ser: 0.89 mg/dL (ref 0.61–1.24)
Creatinine, Ser: 0.76 mg/dL (ref 0.61–1.24)
Creatinine, Ser: 0.63 mg/dL (ref 0.61–1.24)
GFR, Estimated: >60 mL/min (ref >60)
GFR, Estimated: >60 mL/min (ref >60)
GFR, Estimated: >60 mL/min (ref >60)
GFR, Estimated: >60 mL/min (ref >60)
Glucose, Bld: 115 mg/dL — ABNORMAL HIGH (ref 70–99)
Glucose, Bld: 110 mg/dL — ABNORMAL HIGH (ref 70–99)
Glucose, Bld: 117 mg/dL — ABNORMAL HIGH (ref 70–99)
Glucose, Bld: 107 mg/dL — ABNORMAL HIGH (ref 70–99)
Potassium: 3.8 mmol/L (ref 3.5–5.1)
Potassium: 3.6 mmol/L (ref 3.5–5.1)
Potassium: 3.5 mmol/L (ref 3.5–5.1)
Potassium: 3.5 mmol/L (ref 3.5–5.1)
Sodium: 117 mmol/L — CL (ref 135–145)
Sodium: 117 mmol/L — CL (ref 135–145)
Sodium: 121 mmol/L — ABNORMAL LOW (ref 135–145)
Sodium: 121 mmol/L — ABNORMAL LOW (ref 135–145)

## 2020-09-03 LAB — CBC
HCT: 33 % — ABNORMAL LOW (ref 39.0–52.0)
Hemoglobin: 11.8 g/dL — ABNORMAL LOW (ref 13.0–17.0)
MCH: 33.9 pg (ref 26.0–34.0)
MCHC: 35.8 g/dL (ref 30.0–36.0)
MCV: 94.8 fL (ref 80.0–100.0)
Platelets: 417 10*3/uL — ABNORMAL HIGH (ref 150–400)
RBC: 3.48 MIL/uL — ABNORMAL LOW (ref 4.22–5.81)
RDW: 13.4 % (ref 11.5–15.5)
WBC: 5.9 10*3/uL (ref 4.0–10.5)
nRBC: 0 % (ref 0.0–0.2)

## 2020-09-03 LAB — CBG MONITORING, ED
Glucose-Capillary: 71 mg/dL (ref 70–99)
Glucose-Capillary: 121 mg/dL — ABNORMAL HIGH (ref 70–99)
Glucose-Capillary: 109 mg/dL — ABNORMAL HIGH (ref 70–99)
Glucose-Capillary: 103 mg/dL — ABNORMAL HIGH (ref 70–99)
Glucose-Capillary: 112 mg/dL — ABNORMAL HIGH (ref 70–99)

## 2020-09-03 LAB — OSMOLALITY, URINE: Osmolality, Ur: 117 mOsm/kg — ABNORMAL LOW (ref 300–900)

## 2020-09-03 LAB — GLUCOSE, CAPILLARY
Glucose-Capillary: 97 mg/dL (ref 70–99)
Glucose-Capillary: 98 mg/dL (ref 70–99)

## 2020-09-03 LAB — OSMOLALITY: Osmolality: 244 mOsm/kg — CL (ref 275–295)

## 2020-09-03 LAB — HIV ANTIBODY (ROUTINE TESTING W REFLEX): HIV Screen 4th Generation wRfx: NONREACTIVE

## 2020-09-03 LAB — CORTISOL: Cortisol, Plasma: 6.5 ug/dL

## 2020-09-03 LAB — VITAMIN B12: Vitamin B-12: 249 pg/mL (ref 180–914)

## 2020-09-03 MED ORDER — DESMOPRESSIN ACETATE 4 MCG/ML IJ SOLN
2.0000 ug | Freq: Three times a day (TID) | INTRAMUSCULAR | Status: DC
  Administered 2020-09-03 (×3): 2 ug via INTRAVENOUS
  Filled 2020-09-03 (×6): qty 1

## 2020-09-03 MED ORDER — DEXTROSE 5 % IV BOLUS
1000.0000 mL | Freq: Once | INTRAVENOUS | Status: AC
  Administered 2020-09-03: 1000 mL via INTRAVENOUS

## 2020-09-03 MED ORDER — LACTATED RINGERS IV BOLUS
250.0000 mL | Freq: Once | INTRAVENOUS | Status: AC
  Administered 2020-09-03: 250 mL via INTRAVENOUS

## 2020-09-03 MED ORDER — INSULIN ASPART 100 UNIT/ML ~~LOC~~ SOLN
0.0000 [IU] | SUBCUTANEOUS | Status: DC
  Administered 2020-09-03: 1 [IU] via SUBCUTANEOUS
  Filled 2020-09-03: qty 0.09

## 2020-09-03 MED ORDER — ENOXAPARIN SODIUM 40 MG/0.4ML ~~LOC~~ SOLN
40.0000 mg | SUBCUTANEOUS | Status: DC
  Administered 2020-09-03 – 2020-09-08 (×5): 40 mg via SUBCUTANEOUS
  Filled 2020-09-03 (×5): qty 0.4

## 2020-09-03 MED ORDER — DOCUSATE SODIUM 100 MG PO CAPS: 100.0000 mg | ORAL_CAPSULE | Freq: Two times a day (BID) | ORAL | Status: DC | PRN

## 2020-09-03 MED ORDER — HALOPERIDOL LACTATE 5 MG/ML IJ SOLN
5.0000 mg | Freq: Four times a day (QID) | INTRAMUSCULAR | Status: DC | PRN
  Administered 2020-09-05 – 2020-09-07 (×2): 5 mg via INTRAVENOUS
  Filled 2020-09-03 (×2): qty 1

## 2020-09-03 MED ORDER — POLYETHYLENE GLYCOL 3350 17 G PO PACK: 17.0000 g | PACK | Freq: Every day | ORAL | Status: DC | PRN

## 2020-09-03 MED ORDER — ONDANSETRON HCL 4 MG/2ML IJ SOLN: 4.0000 mg | Freq: Four times a day (QID) | INTRAMUSCULAR | Status: DC | PRN

## 2020-09-03 NOTE — Progress Notes (Signed)
eLink Physician-Brief Progress Note Patient Name: CODEN FRANCHI DOB: [birth year] MRN: [id number]   Date of Service  09/03/2020  HPI/Events of Note  Pt due DDAVP 2 mg @ 2200. Current BP 82/50, RN holding DDAVP  Aas per Dr Sherene Sires notes: ltered mental status: workup negative thus far aside from hyponatremia, which is mostly chronic.   Possibly due to recently stopping psychiatry medications?   Possibly 2/2 acute on chronic hyponatremia.  - cortisol 6.5  So ? Relative adrenal insufficiency   eICU Interventions  Discussed with RN. Continue ddavp. - scan bladder for uOP - MAP low. - NPO. No fluids. - LR 250 ml bolus.      Intervention Category Intermediate Interventions: Hypotension - evaluation and management  Ranee Gosselin 09/03/2020, 11:01 PM

## 2020-09-03 NOTE — Progress Notes (Addendum)
Pt has not voided since he arrived to this unit. Bladder scan shows . IV bolus started per order.

## 2020-09-03 NOTE — Progress Notes (Signed)
Spoke w pt's mother and she is aware that pt has been admitted and his rm number.

## 2020-09-03 NOTE — Progress Notes (Signed)
eLink Physician-Brief Progress Note Patient Name: Connor Franklin DOB: 1960/12/14 MRN: [id number]   Date of Service  09/03/2020  HPI/Events of Note  In 3 east. Discussed with Marchelle Folks. Sodium 121, on ddvp.   eICU Interventions  - follow  BMP   At mid night and In AM. - off of telemetry.      Intervention Category Minor Interventions: Other:;Routine modifications to care plan (e.g. PRN medications for pain, fever)  Ranee Gosselin 09/03/2020, 8:45 PM

## 2020-09-03 NOTE — Progress Notes (Signed)
eLink Physician-Brief Progress Note Patient Name: Connor Franklin DOB: 1961-08-13 MRN: [id number]   Date of Service  09/03/2020  HPI/Events of Note  Notified by Dr. Marcos Eke that patient was overcorrecting in terms of their serum sodium and I was asked to assist in management.  Na trend reviewed: - 1/8 @ 2045: Na 113 - 1/9 @ 0340: Na 121  Delta Na: rise of 8 mmol/L over 7 hours.   eICU Interventions  Rapid rise in sodium level from very low level places patient at increased risk of ODS if not corrected.  Immediately ordered ddAVP clamp:  - ddAVP 2 mcg IV Q8H - 1L of D5W to infuse at 250cc/hr - Repeat serum Na level upon completion of D5W infusion (estimated ~ 0900 hrs), with value to be paged to MD when available.      Intervention Category Major Interventions: Electrolyte abnormality - evaluation and management  Marveen Reeks Ashlye Oviedo 09/03/2020, 5:13 AM

## 2020-09-03 NOTE — Progress Notes (Signed)
NAME:  Connor Franklin, MRN:  [id number], DOB:  09-Nov-1960, LOS: 0 ADMISSION DATE:  09/02/2020, CONSULTATION DATE:  09/03/19 REFERRING MD: Criss Alvine, ED   , CHIEF COMPLAINT:  Altered mental satus   Brief History:  Agitation/ams  History of Present Illness:  Connor Franklin is a 60 yo man with a hx of bipolar disorder,  Schizoaffective disorder, depression, DM, GERD, sent to [facility] ED from [facility] SNF with altered mental status.  He was reportedly altered on starting 1/7 but 1/8 reported running into walls, throwing self on ground, aggressive and agitated with staff.   Found to be hyponatremic to 112 in the ED .   He was recently discharged from [facility] 2 weeks PTA, and according to staff at his SNF, we was taken off his psychiatric meds at that time.   He was reportedly altered/agitated as well on 1/7 and seen at [facility] ED. By verbal report from staff, NA at hospital  was 114.  Possible history of hyponatremia in past, some verbal report that he had been taking salt tabs.  Review of recent hx shows sodium generally in 110s back to mid December, 120s-130s prior to that.    Past Medical History:  bipolar disorder,  Schizoaffective disorder, depression, DM, GERD, dyarthria  Home meds:  Coreg, lactulose, klonopin, depakote, risperdal, haldol, trazodone, flomax, proscar. melatonin, NTG  Significant Hospital Events:    Consults:    Procedures:    Significant Diagnostic Tests:  CT head  s contrast 1/8  1. No acute intracranial abnormality. 2. Age related atrophy and chronic small vessel ischemia. Small remote left occipital infarct.  Micro Data:  Resp viral PCR   1/8  Neg flu, neg covid  Antimicrobials:    Interim History / Subjective:  No complaints, says he knows he's in a hospital and tells me where he usually stays but not aware of day of week while watching Sunday nfl countdown on TV  Objective   Blood pressure (!) 149/99, pulse 80, temperature  97.9 F (36.6 C), temperature source Oral, resp. rate 16, SpO2 100 %.        Intake/Output Summary (Last 24 hours) at 09/03/2020 0538 Last data filed at 09/02/2020 2302 Gross per 24 hour  Intake --  Output 2000 ml  Net -2000 ml   There were no vitals filed for this visit.  Examination: General: chronically ill  / nad  Tmax: 97.9     And sats 100% on RA  Pt alert, oriented to person No jvd Oropharynx clear,  mucosa very dry/ edentulous Neck supple Lungs clear  bilaterally RRR no s3 or or sign murmur Abd soft nl excursion  Extr warm with no edema or clubbing noted Neuro  Sensorium as above ,  no apparent motor deficits      Resolved Hospital Problem list     Assessment & Plan:  Altered mental status: workup negative thus far aside from hyponatremia, which is mostly chronic.   Possibly due to recently stopping psychiatry medications?   Possibly 2/2 acute on chronic hyponatremia.  - cortisol 6.5  So ? Relative adrenal insufficiency  >>> repeat 1/10 as part of cort stim study   His Urine SG is extremely low c/w pyschogenic polydypsia and probably would self correct over time if just gradually water restricted but based on severity and assoc ams will need a few days to re-equilibrate here   >>> admit to floor appropriate        Best  practice (evaluated daily)  Diet: NPO for now Pain/Anxiety/Delirium protocol (if indicated): na  VAP protocol (if indicated):  DVT prophylaxis: lovenox GI prophylaxis: n/a  Glucose control: iss  Mobility: bed Disposition:ICU  Goals of Care:  Last date of multidisciplinary goals of care discussion:  Family and staff present:  Summary of discussion:  Follow up goals of care discussion due:  Code Status: DNR/DNI Patient unable to make decisions now but has a signed DNR/DNI form from 05/2020, signed by the patient and physician.     Labs   CBC: Recent Labs  Lab 09/02/20 2045 09/02/20 2058  WBC 8.8  --   NEUTROABS 5.7  --   HGB  11.5* 12.6*  HCT 32.3* 37.0*  MCV 97.3  --   PLT 387  --     Basic Metabolic Panel: Recent Labs  Lab 09/02/20 2045 09/02/20 2058 09/02/20 2316 09/03/20 0137 09/03/20 0340  NA 113* 112* 117* 117* 121*  K 4.1 4.3 3.8 3.6 3.5  CL 80* 79* 84* 83* 88*  CO2 23  --  23 25 24   GLUCOSE 113* 108* 115* 110* 117*  BUN 11 12 11 10 10   CREATININE 0.92 0.90 0.82 0.89 0.76  CALCIUM 9.9  --  9.9 9.9 9.8   GFR: CrCl cannot be calculated (Unknown ideal weight.). Recent Labs  Lab 09/02/20 2045  WBC 8.8    Liver Function Tests: Recent Labs  Lab 09/02/20 2045  AST 25  ALT 18  ALKPHOS 90  BILITOT 1.1  PROT 7.0  ALBUMIN 4.0   No results for input(s): LIPASE, AMYLASE in the last 168 hours. Recent Labs  Lab 09/02/20 2045  AMMONIA 15    ABG    Component Value Date/Time   TCO2 24 09/02/2020 2058     Coagulation Profile: Recent Labs  Lab 09/02/20 2215  INR 1.1    Cardiac Enzymes: No results for input(s): CKTOTAL, CKMB, CKMBINDEX, TROPONINI in the last 168 hours.  HbA1C: Hgb A1c MFr Bld  Date/Time Value Ref Range Status  09/03/2020 12:14 AM 4.9 4.8 - 5.6 % Final    Comment:    (NOTE) Pre diabetes:          5.7%-6.4%  Diabetes:              >6.4%  Glycemic control for   <7.0% adults with diabetes     CBG: Recent Labs  Lab 09/02/20 2023 09/03/20 0132 09/03/20 0347  GLUCAP 109* 71 121*    11/01/20, MD Pulmonary and Critical Care Medicine [facility] Cell [phone]   After 7:00 pm call Elink  [phone]

## 2020-09-03 NOTE — ED Notes (Signed)
CBG- 109 md/dL

## 2020-09-03 NOTE — Progress Notes (Addendum)
E link paged about pts BP 82/50 manual.

## 2020-09-03 NOTE — ED Notes (Signed)
Patient becoming agitated at this time. Floor coverage notified.

## 2020-09-03 NOTE — ED Notes (Addendum)
Pt mother and POA, Fillmore Bynum, called for an update on pt. Gave her an update on the pt.

## 2020-09-04 DIAGNOSIS — E871 Hypo-osmolality and hyponatremia: Secondary | ICD-10-CM | POA: Diagnosis not present

## 2020-09-04 LAB — BASIC METABOLIC PANEL
Anion gap: 9 (ref 5–15)
Anion gap: 11 (ref 5–15)
BUN: 13 mg/dL (ref 6–20)
BUN: 16 mg/dL (ref 6–20)
CO2: 24 mmol/L (ref 22–32)
CO2: 22 mmol/L (ref 22–32)
Calcium: 10 mg/dL (ref 8.9–10.3)
Calcium: 9.8 mg/dL (ref 8.9–10.3)
Chloride: 89 mmol/L — ABNORMAL LOW (ref 98–111)
Chloride: 88 mmol/L — ABNORMAL LOW (ref 98–111)
Creatinine, Ser: 0.94 mg/dL (ref 0.61–1.24)
Creatinine, Ser: 0.96 mg/dL (ref 0.61–1.24)
GFR, Estimated: >60 mL/min (ref >60)
GFR, Estimated: >60 mL/min (ref >60)
Glucose, Bld: 90 mg/dL (ref 70–99)
Glucose, Bld: 88 mg/dL (ref 70–99)
Potassium: 3.6 mmol/L (ref 3.5–5.1)
Potassium: 3.8 mmol/L (ref 3.5–5.1)
Sodium: 122 mmol/L — ABNORMAL LOW (ref 135–145)
Sodium: 121 mmol/L — ABNORMAL LOW (ref 135–145)

## 2020-09-04 LAB — GLUCOSE, CAPILLARY
Glucose-Capillary: 92 mg/dL (ref 70–99)
Glucose-Capillary: 85 mg/dL (ref 70–99)
Glucose-Capillary: 99 mg/dL (ref 70–99)
Glucose-Capillary: 98 mg/dL (ref 70–99)
Glucose-Capillary: 91 mg/dL (ref 70–99)

## 2020-09-04 LAB — CORTISOL: Cortisol, Plasma: 13.3 ug/dL

## 2020-09-04 MED ORDER — SODIUM CHLORIDE 0.9 % IV SOLN: INTRAVENOUS | Status: DC

## 2020-09-04 MED ORDER — DESMOPRESSIN ACETATE 4 MCG/ML IJ SOLN
1.0000 ug | Freq: Two times a day (BID) | INTRAMUSCULAR | Status: DC
  Administered 2020-09-04 – 2020-09-05 (×3): 1 ug via INTRAVENOUS
  Filled 2020-09-04 (×5): qty 1

## 2020-09-04 MED ORDER — COSYNTROPIN 0.25 MG IJ SOLR
0.2500 mg | Freq: Once | INTRAMUSCULAR | Status: AC
  Administered 2020-09-04: 0.25 mg via INTRAVENOUS
  Filled 2020-09-04: qty 0.25

## 2020-09-04 NOTE — Progress Notes (Signed)
NAME:  Connor Franklin, MRN:  [id number], DOB:  [birth year], LOS: 1 ADMISSION DATE:  09/02/2020, CONSULTATION DATE:  09/03/19 REFERRING MD: Criss Alvine, ED   , CHIEF COMPLAINT:  Altered mental satus   Brief History:   Connor Franklin is a 60 yo man with a hx of bipolar disorder,  Schizoaffective disorder, depression, DM, GERD, sent to [facility] ED from [facility] with altered mental status.  He was reportedly altered on starting 1/7 but 1/8 reported running into walls, throwing self on ground, aggressive and agitated with staff.   Found to be hyponatremic to 112 in the ED .   He was recently discharged from [facility] 2 weeks PTA, and according to staff at his SNF, we was taken off his psychiatric meds at that time.   He was reportedly altered/agitated as well on 1/7 and seen at [facility] ED. By verbal report from staff, NA at hospital  was 114.  Possible history of hyponatremia in past, some verbal report that he had been taking salt tabs.  Review of recent hx shows sodium generally in 110s back to mid December, 120s-130s prior to that.    Past Medical History:  bipolar disorder,  Schizoaffective disorder, depression, DM, GERD, dyarthria  Home meds:  Coreg, lactulose, klonopin, depakote, risperdal, haldol, trazodone, flomax, proscar. melatonin, NTG  Significant Hospital Events:  09/03/2020 =-No complaints, says he knows he's in a hospital and tells me where he usually stays but not aware of day of week while watching Sunday nfl countdown on TV  Consults:    Procedures:    Significant Diagnostic Tests:  CT head  s contrast 1/8  1. No acute intracranial abnormality. 2. Age related atrophy and chronic small vessel ischemia. Small remote left occipital infarct.  Micro Data:  Resp viral PCR   1/8  Neg flu, neg covid  Antimicrobials:    Interim History / Subjective:    09/04/2020  - - RN reporing 100- > 150 ml of urine in bladder and doing I/O cath on my order.  Sodium stable. On DDAVP. Per RN not agitated and has been well. Has Na rise in 48h  Recent Labs  Lab 09/03/20 0340 09/03/20 0600 09/03/20 1631 09/04/20 0044 09/04/20 0641  NA 121* 124* 121* 122* 121*   Results for Connor, Franklin (MRN [id number]) as of 09/04/2020 18:11  Ref. Range 09/02/2020 20:45 09/02/2020 20:58 09/02/2020 21:19 09/02/2020 21:30 09/02/2020 23:16 09/03/2020 00:14 09/03/2020 00:15 09/03/2020 01:37 09/03/2020 03:40 09/03/2020 06:00 09/03/2020 16:31 09/04/2020 00:44 09/04/2020 06:41  Sodium Latest Ref Range: 135 - 145 mmol/L 113 (LL) 112 (LL)   117 (LL)   117 (LL) 121 (L) 124 (L) 121 (L) 122 (L) 121 (L)    Objective   Blood pressure 96/65, pulse 88, temperature 98.9 F (37.2 C), temperature source Oral, resp. rate 15, height 5\' 7"  (1.702 m), weight 76.1 kg, SpO2 100 %.        Intake/Output Summary (Last 24 hours) at 09/04/2020 1752 Last data filed at 09/04/2020 1400 Gross per 24 hour  Intake 1537.5 ml  Output 899 ml  Net 638.5 ml   Filed Weights   09/04/20 0500  Weight: 76.1 kg    Examination: General Appearance:  Looks chronic deconditioned but no distress Head:  Normocephalic, without obvious abnormality, atraumatic Eyes:  PERRL - yes, conjunctiva/corneas - muddy     Ears:  Normal external ear canals, both ears Nose:  G tube - no Throat:  ETT TUBE -  no , OG tube - no Neck:  Supple,  No enlargement/tenderness/nodules Lungs: Clear to auscultation bilaterally, Heart:  S1 and S2 normal, no murmur, CVP - no.  Pressors - no Abdomen:  Soft, no masses, no organomegaly Genitalia / Rectal:  Not done Extremities:  Extremities- intact Skin:  ntact in exposed areas . Sacral area - not examined Neurologic:  Sedation - none -> RASS - +1 . Moves all 4s - yes. CAM-ICU - neg . Orientation - x3         Resolved Hospital Problem list     Assessment & Plan:  Altered mental status: workup negative thus far aside from hyponatremia, which is mostly chronic.   Possibly due to  recently stopping psychiatry medications?   Possibly 2/2 acute on chronic hyponatremia.  - cortisol 6.5  So ? Relative adrenal insufficiency  >>> repeat 1/10 as part of cort stim study   His Urine SG is extremely low c/w pyschogenic polydypsia and probably would self correct over time if just gradually water restricted but based on severity and assoc ams will need a few days to re-equilibrate here    09/04/2020 - improved stable Na  Plan - ddavp per prior order - track Na (acute rise prevented) - keep in floor - TRiad primary from 09/05/20 and ccm off - d/w Dr Mila Homer practice (evaluated daily)  Diet: NPO for now Pain/Anxiety/Delirium protocol (if indicated): na  VAP protocol (if indicated):  DVT prophylaxis: lovenox GI prophylaxis: n/a  Glucose control: iss  Mobility: bed Disposition: floor  Goals of Care:  Last date of multidisciplinary goals of care discussion:  Family and staff present:  Summary of discussion:  Follow up goals of care discussion due:  Code Status: DNR/DNI Patient unable to make decisions now but has a signed DNR/DNI form from 05/2020, signed by the patient and physician.      ATTESTATION & SIGNATURE  \   Dr. Kalman Shan, M.D., [facility].C.P Pulmonary and Critical Care Medicine Staff Physician [facility] Pager: [phone], If no answer or between  15:00h - 7:00h: call [phone]  09/04/2020 5:52 PM     LABS    PULMONARY Recent Labs  Lab 09/02/20 2058  TCO2 24    CBC Recent Labs  Lab 09/02/20 2045 09/02/20 2058 09/03/20 0600  HGB 11.5* 12.6* 11.8*  HCT 32.3* 37.0* 33.0*  WBC 8.8  --  5.9  PLT 387  --  417*    COAGULATION Recent Labs  Lab 09/02/20 2215  INR 1.1    CARDIAC  No results for input(s): TROPONINI in the last 168 hours. No results for input(s): PROBNP in the last 168 hours.   CHEMISTRY Recent Labs  Lab 09/03/20 0137 09/03/20 0340  09/03/20 0600 09/03/20 1631 09/04/20 0044 09/04/20 0641  NA 117* 121* 124* 121* 122* 121*  K 3.6 3.5  --  3.5 3.6 3.8  CL 83* 88*  --  88* 89* 88*  CO2 25 24  --  23 24 22   GLUCOSE 110* 117*  --  107* 90 88  BUN 10 10  --  11 13 16   CREATININE 0.89 0.76  --  0.63 0.94 0.96  CALCIUM 9.9 9.8  --  10.3 10.0 9.8   Estimated Creatinine Clearance: 76.5 mL/min (by C-G formula based on SCr of 0.96 mg/dL).   LIVER Recent Labs  Lab 09/02/20 2045 09/02/20 2215  AST 25  --   ALT 18  --   ALKPHOS 90  --   BILITOT 1.1  --   PROT 7.0  --   ALBUMIN 4.0  --   INR  --  1.1     INFECTIOUS No results for input(s): LATICACIDVEN, PROCALCITON in the last 168 hours.   ENDOCRINE CBG (last 3)  Recent Labs    09/04/20 0759 09/04/20 1108 09/04/20 1631  GLUCAP 85 99 98         IMAGING x48h  - image(s) personally visualized  -   highlighted in bold CT Head Wo Contrast  Result Date: 09/02/2020 CLINICAL DATA:  Altered mental status. EXAM: CT HEAD WITHOUT CONTRAST TECHNIQUE: Contiguous axial images were obtained from the base of the skull through the vertex without intravenous contrast. COMPARISON:  Head CT 05/09/2012 FINDINGS: Brain: Age related atrophy. There is mild periventricular and deep white matter hypodensity, nonspecific but typical of chronic small vessel ischemia. No intracranial hemorrhage, mass effect, or midline shift. No hydrocephalus. The basilar cisterns are patent. Small remote medial left occipital infarct unchanged. No evidence of territorial infarct or acute ischemia. No extra-axial or intracranial fluid collection. Vascular: No hyperdense vessel.  Skull base atherosclerosis. Skull: No fracture or focal lesion. Sinuses/Orbits: Mucous retention cysts in the left maxillary sinus. No acute findings. Mastoid air cells are clear. Other: None. IMPRESSION: 1. No acute intracranial abnormality. 2. Age related atrophy and chronic small vessel ischemia. Small remote left occipital  infarct. Electronically Signed   By: Narda Rutherford M.D.   On: 09/02/2020 21:34   DG CHEST PORT 1 VIEW  Result Date: 09/02/2020 CLINICAL DATA:  Hyponatremia. EXAM: PORTABLE CHEST 1 VIEW COMPARISON:  06/02/2000 FINDINGS: The heart size and mediastinal contours are within normal limits. Both lungs are clear. The visualized skeletal structures are unremarkable. IMPRESSION: No active disease. Electronically Signed   By: Katherine Mantle M.D.   On: 09/02/2020 22:16

## 2020-09-04 NOTE — Progress Notes (Signed)
Called [facility] nurse to report that patient unable to void after bolus given.  Waiting for return call.

## 2020-09-04 NOTE — Progress Notes (Signed)
Patient bladder scanned again and only showed. MD notified order received to in and out cath patient.

## 2020-09-04 NOTE — Progress Notes (Signed)
Na has leveled off in low 120s and bp low   1) ok to feed  2) wean off DDAVP   3) needs cort stim testing   4) NS 100 cc per hour   Sandrea Hughs, MD Pulmonary and Critical Care Medicine [facility] Cell [phone]   After 7:00 pm call Elink  [phone]

## 2020-09-04 NOTE — Progress Notes (Signed)
Pt has voided 150 ml. Patient bladder scanned showed 100 ml. MD notified.

## 2020-09-04 NOTE — Progress Notes (Signed)
Pt was finally able to void . Will continue to monitor.

## 2020-09-05 ENCOUNTER — Encounter (HOSPITAL_COMMUNITY): Payer: Self-pay | Admitting: Pulmonary Disease

## 2020-09-05 LAB — BASIC METABOLIC PANEL
Anion gap: 7 (ref 5–15)
BUN: 14 mg/dL (ref 6–20)
CO2: 23 mmol/L (ref 22–32)
Calcium: 9.5 mg/dL (ref 8.9–10.3)
Chloride: 91 mmol/L — ABNORMAL LOW (ref 98–111)
Creatinine, Ser: 0.74 mg/dL (ref 0.61–1.24)
GFR, Estimated: >60 mL/min (ref >60)
Glucose, Bld: 108 mg/dL — ABNORMAL HIGH (ref 70–99)
Potassium: 3.4 mmol/L — ABNORMAL LOW (ref 3.5–5.1)
Sodium: 121 mmol/L — ABNORMAL LOW (ref 135–145)

## 2020-09-05 LAB — GLUCOSE, CAPILLARY
Glucose-Capillary: 113 mg/dL — ABNORMAL HIGH (ref 70–99)
Glucose-Capillary: 117 mg/dL — ABNORMAL HIGH (ref 70–99)
Glucose-Capillary: 81 mg/dL (ref 70–99)
Glucose-Capillary: 87 mg/dL (ref 70–99)
Glucose-Capillary: 89 mg/dL (ref 70–99)
Glucose-Capillary: 89 mg/dL (ref 70–99)
Glucose-Capillary: 99 mg/dL (ref 70–99)

## 2020-09-05 LAB — CBC WITH DIFFERENTIAL/PLATELET
Abs Immature Granulocytes: 0.01 10*3/uL (ref 0.00–0.07)
Basophils Absolute: 0.1 10*3/uL (ref 0.0–0.1)
Basophils Relative: 1 %
Eosinophils Absolute: 0 10*3/uL (ref 0.0–0.5)
Eosinophils Relative: 1 %
HCT: 26.5 % — ABNORMAL LOW (ref 39.0–52.0)
Hemoglobin: 9.4 g/dL — ABNORMAL LOW (ref 13.0–17.0)
Immature Granulocytes: 0 %
Lymphocytes Relative: 27 %
Lymphs Abs: 1.5 10*3/uL (ref 0.7–4.0)
MCH: 34.1 pg — ABNORMAL HIGH (ref 26.0–34.0)
MCHC: 35.5 g/dL (ref 30.0–36.0)
MCV: 96 fL (ref 80.0–100.0)
Monocytes Absolute: 1 10*3/uL (ref 0.1–1.0)
Monocytes Relative: 18 %
Neutro Abs: 3 10*3/uL (ref 1.7–7.7)
Neutrophils Relative %: 53 %
Platelets: 362 10*3/uL (ref 150–400)
RBC: 2.76 MIL/uL — ABNORMAL LOW (ref 4.22–5.81)
RDW: 13.7 % (ref 11.5–15.5)
WBC: 5.6 10*3/uL (ref 4.0–10.5)
nRBC: 0 % (ref 0.0–0.2)

## 2020-09-05 LAB — URIC ACID: Uric Acid, Serum: 3.9 mg/dL (ref 3.7–8.6)

## 2020-09-05 MED ORDER — SODIUM CHLORIDE 1 G PO TABS
2.0000 g | ORAL_TABLET | Freq: Two times a day (BID) | ORAL | Status: DC
  Administered 2020-09-05 – 2020-09-06 (×3): 2 g via ORAL
  Filled 2020-09-05 (×4): qty 2

## 2020-09-05 MED ORDER — CARVEDILOL 12.5 MG PO TABS
12.5000 mg | ORAL_TABLET | Freq: Two times a day (BID) | ORAL | Status: DC
  Administered 2020-09-05 – 2020-09-08 (×4): 12.5 mg via ORAL
  Filled 2020-09-05 (×5): qty 1

## 2020-09-05 MED ORDER — POTASSIUM CHLORIDE CRYS ER 20 MEQ PO TBCR
40.0000 meq | EXTENDED_RELEASE_TABLET | Freq: Once | ORAL | Status: AC
  Administered 2020-09-05: 40 meq via ORAL
  Filled 2020-09-05: qty 2

## 2020-09-05 MED ORDER — FINASTERIDE 5 MG PO TABS
5.0000 mg | ORAL_TABLET | Freq: Every day | ORAL | Status: DC
  Administered 2020-09-05 – 2020-09-08 (×4): 5 mg via ORAL
  Filled 2020-09-05 (×4): qty 1

## 2020-09-05 MED ORDER — RISPERIDONE 1 MG PO TABS
4.0000 mg | ORAL_TABLET | Freq: Every day | ORAL | Status: DC
Start: 2020-09-05 — End: 2020-09-08
  Administered 2020-09-05 – 2020-09-08 (×4): 4 mg via ORAL
  Filled 2020-09-05 (×4): qty 4

## 2020-09-05 MED ORDER — DEMECLOCYCLINE HCL 150 MG PO TABS: 300.0000 mg | ORAL_TABLET | Freq: Two times a day (BID) | ORAL | Status: DC

## 2020-09-05 MED ORDER — CLONAZEPAM 1 MG PO TABS
1.0000 mg | ORAL_TABLET | Freq: Every day | ORAL | Status: DC
  Administered 2020-09-05 – 2020-09-07 (×3): 1 mg via ORAL
  Filled 2020-09-05 (×3): qty 1

## 2020-09-05 MED ORDER — TRAZODONE HCL 100 MG PO TABS
150.0000 mg | ORAL_TABLET | Freq: Every day | ORAL | Status: DC
  Administered 2020-09-05 – 2020-09-07 (×3): 150 mg via ORAL
  Filled 2020-09-05 (×3): qty 1

## 2020-09-05 MED ORDER — TAMSULOSIN HCL 0.4 MG PO CAPS
0.4000 mg | ORAL_CAPSULE | Freq: Every day | ORAL | Status: DC
  Administered 2020-09-05 – 2020-09-08 (×4): 0.4 mg via ORAL
  Filled 2020-09-05 (×4): qty 1

## 2020-09-05 MED ORDER — MELATONIN 5 MG PO TABS
5.0000 mg | ORAL_TABLET | Freq: Every day | ORAL | Status: DC
  Administered 2020-09-05 – 2020-09-07 (×3): 5 mg via ORAL
  Filled 2020-09-05 (×3): qty 1

## 2020-09-05 MED ORDER — FUROSEMIDE 20 MG PO TABS
20.0000 mg | ORAL_TABLET | Freq: Every day | ORAL | Status: DC
Start: 2020-09-05 — End: 2020-09-06
  Administered 2020-09-05 – 2020-09-06 (×2): 20 mg via ORAL
  Filled 2020-09-05 (×2): qty 1

## 2020-09-05 MED ORDER — LORAZEPAM 2 MG/ML IJ SOLN
1.0000 mg | Freq: Once | INTRAMUSCULAR | Status: AC
  Administered 2020-09-05: 1 mg via INTRAVENOUS
  Filled 2020-09-05: qty 1

## 2020-09-05 NOTE — Progress Notes (Addendum)
PROGRESS NOTE    Connor Franklin  AXK:[id number] DOB: [birth year] DOA: 09/02/2020 PCP: Galvin Proffer, MD   Chief Complain: Confusion  Brief Narrative: Patient is a 60 year old male with history of bipolar disorder, schizoaffective  disorder , depression, diabetes, GERD who was brought to the emergency department from nursing facility with complaints of altered mental status.  Lab work showed sodium of 112 in the emergency department.  Patient was admitted for further management of hyponatremia/altered mental status.  He has history of chronic hyponatremia.  CT head did not show any acute intracranial abnormality.  Assessment & Plan:   Active Problems:   Hyponatremia   Altered mental status: CT head did not show any acute intracranial abnormalities.  Ammonia level is normal.  Thought to be secondary to hyponatremia. he was on multiple psychiatric medications at nursing facility. As per the mother, he is alert and oriented at baseline. He looks very comfortable today, answer most of my questions.  Hard time understanding his speech but is like he is alert and oriented.  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 TSH, cortisol normal. Will start on salt tablets 2 g twice daily.  He was given IV fluids which  stopping.  He was given desmopressin, stop now.  Started on low-dose Lasix.   he takes salt tablets at baseline at nursing facility.  Chest x-ray did not show any malignancy.  We will refer him to nephrology after discharge.  History of BPH: On Proscar, tamsulosin .  History of bipolar disorder/shizoeffective  disorder: On clonazepam, risperidone, trazodone, Depakote, Haldol at nursing facility He needs to follow-up with his psychiatrist as an outpatient  Hypertension: On Coreg.  Current blood pressure stable.         DVT  prophylaxis:Lovenox Code Status: Full Family Communication: Discussed with mother on phone Status is: Inpatient  Remains inpatient appropriate because:Inpatient level of care appropriate due to severity of illness   Dispo: The patient is from: SNF              Anticipated d/c is to: SNF              Anticipated d/c date is:1-2 days              Patient currently is not medically stable to d/c.    Consultants: PCCM  Procedures:None  Antimicrobials:  Anti-infectives (From admission, onward)   None      Subjective: Patient seen and examined the bedside this morning.  Hemodynamically stable.  Alert and awake and answer most of my questions.  He told me that the current month is December.  Continue negative distress.  Any complaints  Objective: Vitals:   09/04/20 2035 09/04/20 2107 09/04/20 2125 09/05/20 0615  BP: 114/70 116/63 112/74 139/77  Pulse: 74 78 79 72  Resp: 18   18  Temp: 98.7 F (37.1 C)  98.6 F (37 C) 97.7 F (36.5 C)  TempSrc: Oral   Oral  SpO2: 100% 100% 100% 100%  Weight:      Height:        Intake/Output Summary (Last 24 hours) at 09/05/2020 0756 Last data filed at 09/05/2020 0600 Gross per 24 hour  Intake 3822.81 ml  Output 1000 ml  Net 2822.81 ml   Filed Weights   09/04/20 0500  Weight: 76.1 kg  Examination:  General exam: Appears calm and comfortable ,Not in distress,average built HEENT:PERRL,Oral mucosa moist, Ear/Nose normal on gross exam Respiratory system: Bilateral equal air entry, normal vesicular breath sounds, no wheezes or crackles  Cardiovascular system: S1 & S2 heard, RRR. No JVD, murmurs, rubs, gallops or clicks. No pedal edema. Gastrointestinal system: Abdomen is nondistended, soft and nontender. No organomegaly or masses felt. Normal bowel sounds heard. Central nervous system: Alert and awake.  Oriented to place Extremities: No edema, no clubbing ,no cyanosis Skin: No rashes, lesions or ulcers,no icterus ,no  pallor Psychiatry: Judgement and insight appear impaired    Data Reviewed: I have personally reviewed following labs and imaging studies  CBC: Recent Labs  Lab 09/02/20 2045 09/02/20 2058 09/03/20 0600  WBC 8.8  --  5.9  NEUTROABS 5.7  --   --   HGB 11.5* 12.6* 11.8*  HCT 32.3* 37.0* 33.0*  MCV 97.3  --  94.8  PLT 387  --  417*   Basic Metabolic Panel: Recent Labs  Lab 09/03/20 0137 09/03/20 0340 09/03/20 0600 09/03/20 1631 09/04/20 0044 09/04/20 0641  NA 117* 121* 124* 121* 122* 121*  K 3.6 3.5  --  3.5 3.6 3.8  CL 83* 88*  --  88* 89* 88*  CO2 25 24  --  23 24 22   GLUCOSE 110* 117*  --  107* 90 88  BUN 10 10  --  11 13 16   CREATININE 0.89 0.76  --  0.63 0.94 0.96  CALCIUM 9.9 9.8  --  10.3 10.0 9.8   GFR: Estimated Creatinine Clearance: 76.5 mL/min (by C-G formula based on SCr of 0.96 mg/dL). Liver Function Tests: Recent Labs  Lab 09/02/20 2045  AST 25  ALT 18  ALKPHOS 90  BILITOT 1.1  PROT 7.0  ALBUMIN 4.0   No results for input(s): LIPASE, AMYLASE in the last 168 hours. Recent Labs  Lab 09/02/20 2045  AMMONIA 15   Coagulation Profile: Recent Labs  Lab 09/02/20 2215  INR 1.1   Cardiac Enzymes: No results for input(s): CKTOTAL, CKMB, CKMBINDEX, TROPONINI in the last 168 hours. BNP (last 3 results) No results for input(s): PROBNP in the last 8760 hours. HbA1C: Recent Labs    09/03/20 0014  HGBA1C 4.9   CBG: Recent Labs  Lab 09/04/20 1631 09/04/20 2029 09/05/20 0033 09/05/20 0420 09/05/20 0735  GLUCAP 98 91 87 89 117*   Lipid Profile: No results for input(s): CHOL, HDL, LDLCALC, TRIG, CHOLHDL, LDLDIRECT in the last 72 hours. Thyroid Function Tests: Recent Labs    09/02/20 2130  TSH 0.723   Anemia Panel: Recent Labs    09/02/20 2130 09/03/20 0015  VITAMINB12 249  --   FOLATE  --  8.0   Sepsis Labs: No results for input(s): PROCALCITON, LATICACIDVEN in the last 168 hours.  Recent Results (from the past 240 hour(s))   Resp Panel by RT-PCR (Flu A&B, Covid) Nasopharyngeal Swab     Status: None   Collection Time: 09/02/20  9:13 PM   Specimen: Nasopharyngeal Swab; Nasopharyngeal(NP) swabs in vial transport medium  Result Value Ref Range Status   SARS Coronavirus 2 by RT PCR NEGATIVE NEGATIVE Final    Comment: (NOTE) SARS-CoV-2 target nucleic acids are NOT DETECTED.  The SARS-CoV-2 RNA is generally detectable in upper respiratory specimens during the acute phase of infection. The lowest concentration of SARS-CoV-2 viral copies this assay can detect is 138 copies/mL. A negative result does not preclude SARS-Cov-2 infection and should not be  used as the sole basis for treatment or other patient management decisions. A negative result may occur with  improper specimen collection/handling, submission of specimen other than nasopharyngeal swab, presence of viral mutation(s) within the areas targeted by this assay, and inadequate number of viral copies(<138 copies/mL). A negative result must be combined with clinical observations, patient history, and epidemiological information. The expected result is Negative.  Fact Sheet for Patients:  BloggerCourse.com  Fact Sheet for Healthcare Providers:  SeriousBroker.it  This test is no t yet approved or cleared by the [place] FDA and  has been authorized for detection and/or diagnosis of SARS-CoV-2 by FDA under an Emergency Use Authorization (EUA). This EUA will remain  in effect (meaning this test can be used) for the duration of the COVID-19 declaration under Section 564(b)(1) of the Act, 21 U.S.C.section 360bbb-3(b)(1), unless the authorization is terminated  or revoked sooner.       Influenza A by PCR NEGATIVE NEGATIVE Final   Influenza B by PCR NEGATIVE NEGATIVE Final    Comment: (NOTE) The Xpert Xpress SARS-CoV-2/FLU/RSV plus assay is intended as an aid in the diagnosis of influenza from  Nasopharyngeal swab specimens and should not be used as a sole basis for treatment. Nasal washings and aspirates are unacceptable for Xpert Xpress SARS-CoV-2/FLU/RSV testing.  Fact Sheet for Patients: BloggerCourse.com  Fact Sheet for Healthcare Providers: SeriousBroker.it  This test is not yet approved or cleared by the [place] FDA and has been authorized for detection and/or diagnosis of SARS-CoV-2 by FDA under an Emergency Use Authorization (EUA). This EUA will remain in effect (meaning this test can be used) for the duration of the COVID-19 declaration under Section 564(b)(1) of the Act, 21 U.S.C. section 360bbb-3(b)(1), unless the authorization is terminated or revoked.  Performed at [facility], [address], [place], [place] [address]          Radiology Studies: No results found.      Scheduled Meds: . desmopressin  1 mcg Intravenous Q12H  . enoxaparin (LOVENOX) injection  40 mg Subcutaneous Q24H  . insulin aspart  0-9 Units Subcutaneous Q4H  . sodium chloride  2 g Oral BID WC   Continuous Infusions: . sodium chloride 100 mL/hr at 09/05/20 0712     LOS: 2 days    Time spent: 35 mins.More than 50% of that time was spent in counseling and/or coordination of care.      Burnadette Pop, MD [facility] P1/06/2021, 7:56 AM

## 2020-09-05 NOTE — TOC Initial Note (Signed)
Transition of Care [facility]) - Initial/Assessment Note    Patient Details  Name: Connor Franklin MRN: [id number] Date of Birth: April 17, 1961  Transition of [facility]) CM/SW Contact:    Clearance Coots, LCSW Phone Number: 09/05/2020, 2:52 PM  Clinical Narrative:      Patient admitted for agitation and AMS.            CSW reached out to the patient mother to discuss introduce and role and discuss the patient. Patient mother reports the patient is a resident at [facility] since 2017. Patient will return at discharge. She reports the patient is usually alert x4. The patient is alert x2 at this time. CSW reached out to facility and spoke with the patient medical technician supervisor Denny Peon about the patient care at the facility. She reports the patient receives assistance with his ADL's. He walks independently. The facility will need an updated FL2.   PT consult pending.   Expected Discharge Plan: Assisted Living Barriers to Discharge: Continued Medical Work up   Patient Goals and CMS Choice        Expected Discharge Plan and Services Expected Discharge Plan: Assisted Living In-house Referral: Clinical Social Work Discharge Planning Services: CM Consult   Living arrangements for the past 2 months: Assisted Living Facility                                      Prior Living Arrangements/Services Living arrangements for the past 2 months: Assisted Living Facility Lives with:: Facility Resident Patient language and need for interpreter reviewed:: No Do you feel safe going back to the place where you live?: Yes      Need for Family Participation in Patient Care: Yes (Comment) Care giver support system in place?: Yes (comment)   Criminal Activity/Legal Involvement Pertinent to Current Situation/Hospitalization: No - Comment as needed  Activities of Daily Living Home Assistive Devices/Equipment: Other (Comment) (pt resides at a snf) ADL  Screening (condition at time of admission) Patient's cognitive ability adequate to safely complete daily activities?: No Is the patient deaf or have difficulty hearing?: No Does the patient have difficulty seeing, even when wearing glasses/contacts?: No Does the patient have difficulty concentrating, remembering, or making decisions?: Yes Patient able to express need for assistance with ADLs?: No Does the patient have difficulty dressing or bathing?: Yes Independently performs ADLs?: No Does the patient have difficulty walking or climbing stairs?: Yes Weakness of Legs: None Weakness of Arms/Hands: None  Permission Sought/Granted Permission sought to share information with : Family Supports                Emotional Assessment Appearance:: Appears stated age   Affect (typically observed): Unable to Assess Orientation: : Oriented to Self,Oriented to Place Alcohol / Substance Use: Not Applicable Psych Involvement: No (comment)  Admission diagnosis:  Hyponatremia [E87.1] Patient Active Problem List   Diagnosis Date Noted  . Hyponatremia 09/03/2020   PCP:  Galvin Proffer, MD Pharmacy:   [facility] - [place], [place] - [phone] [address] [place] [address] Phone: [phone] Fax: [phone]     Social Determinants of Health (SDOH) Interventions    Readmission Risk Interventions No flowsheet data found.

## 2020-09-05 NOTE — Progress Notes (Addendum)
Pt has not voided in 7 hours. Bladder scan showed 323 mL. MD notified. 1 in and out cath ordered.   600 mL removed. Will continue to monitor.

## 2020-09-05 NOTE — Evaluation (Signed)
Physical Therapy Evaluation Patient Details Name: Connor Franklin MRN: [id number] DOB: 1961-07-27 Today's Date: 09/05/2020   History of Present Illness  60 year old male with history of bipolar disorder, schizoaffective  disorder , depression, diabetes, GERD who was brought to the emergency department from nursing facility with complaints of altered mental status.  Lab work showed sodium of 112 in the emergency department.  Patient was admitted for further management of hyponatremia/altered mental status.  He has history of chronic hyponatremia.  CT head did not show any acute intracranial abnormality  Clinical Impression  Patient evaluated by Physical Therapy with no further acute PT needs identified. All education has been completed and the patient has no further questions. Pt pleasantly confused and able to mobilize around room with supervision for safety due to impaired cognition.  Pt perseverating on winning a million dollars.  Pt appears at his baseline in regards to mobility, only decreased cognition limiting safety. See below for any follow-up Physical Therapy or equipment needs. PT is signing off. Thank you for this referral.     Follow Up Recommendations No PT follow up    Equipment Recommendations  None recommended by PT    Recommendations for Other Services       Precautions / Restrictions Precautions Precautions: Fall      Mobility  Bed Mobility Overal bed mobility: Modified Independent                  Transfers Overall transfer level: Needs assistance Equipment used: None Transfers: Sit to/from Stand Sit to Stand: Min guard;Supervision         General transfer comment: for safety due to cognition  Ambulation/Gait Ambulation/Gait assistance: Min guard;Supervision Gait Distance (Feet): 16 Feet (x2) Assistive device: None Gait Pattern/deviations: Step-through pattern;Decreased stride length     General Gait Details: min/guard - supervision for  safety, not mindful of condom catheter, assisted to/from bathroom x2  Stairs            Wheelchair Mobility    Modified Rankin (Stroke Patients Only)       Balance Overall balance assessment: Mild deficits observed, not formally tested (no UE support required, pt with poor cognition)                                           Pertinent Vitals/Pain Pain Assessment: Faces Faces Pain Scale: No hurt    Home Living Family/patient expects to be discharged to:: Assisted living                      Prior Function           Comments: from [facility] team note: "CSW reached out to facility and spoke with the patient medical technician supervisor Denny Peon about the patient care at the facility. She reports the patient receives assistance with his ADL's. He walks independently."     Hand Dominance        Extremity/Trunk Assessment        Lower Extremity Assessment Lower Extremity Assessment: Overall WFL for tasks assessed    Cervical / Trunk Assessment Cervical / Trunk Assessment: Normal  Communication      Cognition Arousal/Alertness: Awake/alert Behavior During Therapy: Restless Overall Cognitive Status: Impaired/Different from baseline  General Comments: typically A/Ox4 per chart, only orientated to self, stating he won a million dollars and also stating different objects around the room look black      General Comments      Exercises     Assessment/Plan    PT Assessment Patent does not need any further PT services  PT Problem List         PT Treatment Interventions      PT Goals (Current goals can be found in the Care Plan section)  Acute Rehab PT Goals PT Goal Formulation: All assessment and education complete, DC therapy    Frequency     Barriers to discharge        Co-evaluation               AM-PAC PT "6 Clicks" Mobility  Outcome Measure Help needed turning from  your back to your side while in a flat bed without using bedrails?: None Help needed moving from lying on your back to sitting on the side of a flat bed without using bedrails?: None Help needed moving to and from a bed to a chair (including a wheelchair)?: None Help needed standing up from a chair using your arms (e.g., wheelchair or bedside chair)?: None Help needed to walk in hospital room?: A Little Help needed climbing 3-5 steps with a railing? : A Little 6 Click Score: 22    End of Session   Activity Tolerance: Patient tolerated treatment well Patient left: in bed;with bed alarm set;with call bell/phone within reach Nurse Communication: Mobility status PT Visit Diagnosis: Difficulty in walking, not elsewhere classified (R26.2)    Time: 1520-1540 PT Time Calculation (min) (ACUTE ONLY): 20 min   Charges:   PT Evaluation $PT Eval Low Complexity: 1 Low     Kati PT, DPT Acute Rehabilitation Services Pager: [phone] Office: [phone]  Maida Sale E 09/05/2020, 3:48 PM

## 2020-09-05 NOTE — Progress Notes (Signed)
Patient pushing and kicking staff, not following commands, and becoming verbally aggressive. MD made aware. Non-violent restraints initiated. POA called. Voicemail left.

## 2020-09-06 LAB — GLUCOSE, CAPILLARY
Glucose-Capillary: 102 mg/dL — ABNORMAL HIGH (ref 70–99)
Glucose-Capillary: 104 mg/dL — ABNORMAL HIGH (ref 70–99)
Glucose-Capillary: 119 mg/dL — ABNORMAL HIGH (ref 70–99)
Glucose-Capillary: 112 mg/dL — ABNORMAL HIGH (ref 70–99)
Glucose-Capillary: 145 mg/dL — ABNORMAL HIGH (ref 70–99)

## 2020-09-06 LAB — BASIC METABOLIC PANEL
Anion gap: 7 (ref 5–15)
BUN: 11 mg/dL (ref 6–20)
CO2: 23 mmol/L (ref 22–32)
Calcium: 10 mg/dL (ref 8.9–10.3)
Chloride: 92 mmol/L — ABNORMAL LOW (ref 98–111)
Creatinine, Ser: 0.67 mg/dL (ref 0.61–1.24)
GFR, Estimated: >60 mL/min (ref >60)
Glucose, Bld: 108 mg/dL — ABNORMAL HIGH (ref 70–99)
Potassium: 3.7 mmol/L (ref 3.5–5.1)
Sodium: 122 mmol/L — ABNORMAL LOW (ref 135–145)

## 2020-09-06 LAB — SODIUM: Sodium: 124 mmol/L — ABNORMAL LOW (ref 135–145)

## 2020-09-06 MED ORDER — SODIUM CHLORIDE 0.9 % IV SOLN: INTRAVENOUS | Status: DC

## 2020-09-06 MED ORDER — NICOTINE 14 MG/24HR TD PT24
14.0000 mg | MEDICATED_PATCH | Freq: Every day | TRANSDERMAL | Status: DC
  Administered 2020-09-08: 14 mg via TRANSDERMAL
  Filled 2020-09-06 (×2): qty 1

## 2020-09-06 NOTE — Consult Note (Signed)
Renal Service Consult Note [facility]  Connor Franklin 09/06/2020 Maree Krabbe, MD Requesting Physician: Dr Renford Dills  Reason for Consult: Renal failure HPI: The patient is a 60 y.o. year-old w/ hx of schizoaffective d/o, back pain, GERD, DM, depression, bipolar and DJD presented on 1/08 from [facility] sent for AMS, pt was off of psych meds for about 2 wks. Pt was aggressive, running into walls and agitated w/ staff. Na was 113 on admit and improved up to 122 today. He was initially given IV LR bolus, D5W boluS and NS 0.9% at 100 cc/hr.  Lasix 20 qd and salt tabs 2 gm bid started yesterday. Asked to see for hyponatremia.    Labs showed >>   UNa 40, UOsm 117    UA is negative    Am cortisol 13.3, wnl    TSH  0.723 iIU/ml   BP are wnl 125/ 85, HR 60's  RR normal, afeb  Pt seen in room. He is w/o complaints. Not drinking a lot of water, denies any binge drinking of liquids. No etoh. +tobacco. Eats "normal" food. No SOB or cough or CP. No recent GI illness. He says at the SNF he "gets up and walks around".     ROS  denies CP  no joint pain   no HA  no blurry vision  no rash  no diarrhea  no nausea/ vomiting  no dysuria  no difficulty voiding  no change in urine color    Past Medical History  Past Medical History:  Diagnosis Date  . Arthritis   . Bipolar disorder (HCC)   . Depression   . DM (diabetes mellitus) (HCC)   . Dorsalgia   . Dysarthria and anarthria   . GERD (gastroesophageal reflux disease)   . Hernia, umbilical   . Insomnia   . Low back pain   . Schizoaffective disorder (HCC)   . Varicose veins of both lower extremities    Past Surgical History  Past Surgical History:  Procedure Laterality Date  . ABDOMINAL SURGERY    . ANKLE SURGERY Right   . WRIST SURGERY Right    Family History  Family History  Family history unknown: Yes   Social History  reports that he has been smoking. He has quit using smokeless tobacco.  His  smokeless tobacco use included chew. He reports previous alcohol use. He reports that he does not use drugs. Allergies  Allergies  Allergen Reactions  . Elavil [Amitriptyline Hcl]   . Sulfa Antibiotics    Home medications Prior to Admission medications   Medication Sig Start Date End Date Taking? Authorizing Provider  acetaminophen (TYLENOL) 500 MG tablet Take 1,000 mg by mouth in the morning, at noon, and at bedtime.   Yes [provider]  carvedilol (COREG) 12.5 MG tablet Take 12.5 mg by mouth 2 (two) times daily with a meal.   Yes [provider]  clonazePAM (KLONOPIN) 1 MG tablet Take 1 mg by mouth at bedtime.   Yes [provider]  docusate sodium (COLACE) 100 MG capsule Take 200 mg by mouth at bedtime.   Yes [provider]  finasteride (PROSCAR) 5 MG tablet Take 5 mg by mouth daily.   Yes [provider]  lactulose (CHRONULAC) 10 GM/15ML solution Take 20 g by mouth daily.   Yes [provider]  melatonin 5 MG TABS Take 5 mg by mouth at bedtime.   Yes [provider]  risperiDONE (RISPERDAL) 2 MG tablet  Take 4 mg by mouth in the morning.   Yes [provider]  sodium chloride 1 g tablet Take 1 g by mouth daily.   Yes [provider]  tamsulosin (FLOMAX) 0.4 MG CAPS capsule Take 0.4 mg by mouth in the morning.   Yes [provider]  traZODone (DESYREL) 150 MG tablet Take 150 mg by mouth at bedtime.   Yes [provider]  divalproex (DEPAKOTE) 250 MG DR tablet Take 750 mg by mouth at bedtime. Patient not taking: No sig reported    [provider]  haloperidol (HALDOL) 1 MG tablet Take 3 mg by mouth 2 (two) times daily. T 2 pm and at bedtime Patient not taking: No sig reported    [provider]  nitroGLYCERIN (NITROSTAT) 0.4 MG SL tablet Place 0.4 mg under the tongue every 5 (five) minutes as needed for chest pain.    [provider]  sodium chloride 1 g tablet  Take 1 g by mouth 4 (four) times daily. Patient not taking: No sig reported    [provider]     Vitals:   09/05/20 1410 09/05/20 2051 09/06/20 0520 09/06/20 1346  BP: 133/76 (!) 141/83 122/76 123/84  Pulse: 76 67 60 61  Resp: 17 18 18 18   Temp: 98 F (36.7 C) 98.4 F (36.9 C) 97.8 F (36.6 C)   TempSrc: Oral Oral Oral   SpO2: 100% 100% 99% 100%  Weight:      Height:       Exam Gen disheveled, pleasant man No rash, cyanosis or gangrene Sclera anicteric, throat clear, mouth slightly dry  No jvd or bruits Chest clear bilat to bases RRR no MRG Abd soft ntnd no mass or ascites +bs GU normal male  MS no joint effusions or deformity Ext no UE or LE edema, no wounds or ulcers Neuro is alert, Ox 3 , nf, mild gen'd weakness    Home meds:  - coreg 12.5 bid/ sl ntg prn  - haldol 3mg  bid / depakote 750 mg hs/ trazodone 150 hs/ risperdal 4 mg hs/ clonazepam 1 mg hs  - proscar 5 mg qd/ lactulose 20 gm qd/ salt tabs 1 gm qd/ flomax 0.4 qd    Labs showed >>   UNa 40, UOsm 117    UA is negative    Am cortisol 13.3, wnl    TSH  0.723 iIU/ml   BP are wnl 125/ 85, HR 60's  RR normal, afeb    CXR 1/08 - IMPRESSION: No active disease.    Assessment/ Plan: 1. Hyponatremia - Na+ 113 on admission, up to 122 today, treated mostly w/ isotonic fluids here. Uosm is low and UNa normal, looks dry to euvolemic on exam. Normal TSH/ cortisol.  I don't see any signs of excessive fluid intake off the bat. Suspect vol depletion may still be the issue, will resume 0.9% saline. Will follow.  2. H/o schizoaffective and bipolar d/o 3. Depression      Rob Lynnita Somma  MD 09/06/2020, 2:15 PM  Recent Labs  Lab 09/03/20 0600 09/05/20 0807  WBC 5.9 5.6  HGB 11.8* 9.4*   Recent Labs  Lab 09/05/20 0807 09/06/20 0526  K 3.4* 3.7  BUN 14 11  CREATININE 0.74 0.67  CALCIUM 9.5 10.0

## 2020-09-06 NOTE — Progress Notes (Signed)
PROGRESS NOTE    Connor Franklin  MCN:[id number] DOB: [birth year] DOA: 09/02/2020 PCP: Galvin Proffer, MD   Chief Complain: Confusion  Brief Narrative: Patient is a 60 year old male with history of bipolar disorder, schizoaffective  disorder , depression, diabetes, GERD who was brought to the emergency department from nursing facility with complaints of altered mental status.  Lab work showed sodium of 112 in the emergency department.  Patient was admitted for further management of hyponatremia/altered mental status.  He has history of chronic hyponatremia.  CT head did not show any acute intracranial abnormality.  Hospital course also remarkable for agitation.  Today he is completely alert and oriented.  Plan for transfer back to his home facility tomorrow.  Assessment & Plan:   Active Problems:   Hyponatremia   Altered mental status: CT head did not show any acute intracranial abnormalities.  Ammonia level is normal.  Thought to be secondary to hyponatremia. he was on multiple psychiatric medications at nursing facility. As per the mother, he is alert and oriented at baseline. He became agitated in the evening of 09/05/2020 and restraints have to be applied.  This morning he was completely alert and oriented.  We will remove the restraints.  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 TSH, cortisol normal. We started on salt tablets 2 g twice daily.  He was given IV fluids which has been stopped.  He was given desmopressin, stopped .  Started on low-dose Lasix.   he takes salt tablets at baseline at nursing facility.  Chest x-ray did not show any malignancy.  We will refer him to nephrology after discharge.  History of BPH: On Proscar, tamsulosin .  History of bipolar disorder/shizoeffective  disorder: On clonazepam, risperidone,  trazodone, Depakote, Haldol at nursing facility.  These medications have been resumed. He needs to follow-up with his psychiatrist as an outpatient  Hypertension: On Coreg.  Current blood pressure stable.         DVT prophylaxis:Lovenox Code Status: Full Family Communication: Discussed with mother on phone on 09/05/2020 Status is: Inpatient  Remains inpatient appropriate because:Inpatient level of care appropriate due to severity of illness   Dispo: The patient is from: SNF              Anticipated d/c is to: SNF              Anticipated d/c date is: Tomorrow              Patient currently is not medically stable to d/c.    Consultants: PCCM  Procedures:None  Antimicrobials:  Anti-infectives (From admission, onward)   Start     Dose/Rate Route Frequency Ordered Stop   09/05/20 1245  demeclocycline (DECLOMYCIN) tablet 300 mg  Status:  Discontinued        300 mg Oral Every 12 hours 09/05/20 1152 09/05/20 1153      Subjective: Patient seen and examined at the bedside this morning.  Hemodynamically stable.  Comfortable.  Completely alert and oriented today.  Denies any complaints  Objective: Vitals:   09/05/20 0835 09/05/20 1410 09/05/20 2051 09/06/20 0520  BP: 136/84 133/76 (!) 141/83 122/76  Pulse: 72 76 67 60  Resp:  17 18 18   Temp:  98 F (36.7 C) 98.4 F (36.9 C) 97.8 F (36.6 C)  TempSrc:  Oral Oral Oral  SpO2:  100% 100% 99%  Weight:      Height:        Intake/Output Summary (Last 24 hours) at 09/06/2020 0737 Last data filed at 09/06/2020 0600 Gross per 24 hour  Intake 1080 ml  Output 3900 ml  Net -2820 ml   Filed Weights   09/04/20 0500  Weight: 76.1 kg    Examination:  General exam: Appears calm and comfortable ,Not in distress,average built HEENT:PERRL,Oral mucosa moist, Ear/Nose normal on gross exam Respiratory system: Bilateral equal air entry, normal vesicular breath sounds, no wheezes or crackles  Cardiovascular system: S1 & S2 heard,  RRR. No JVD, murmurs, rubs, gallops or clicks. Gastrointestinal system: Abdomen is nondistended, soft and nontender. No organomegaly or masses felt. Normal bowel sounds heard. Central nervous system: Alert and oriented. No focal neurological deficits. Extremities: No edema, no clubbing ,no cyanosis Skin: No rashes, lesions or ulcers,no icterus ,no pallor Psychiatry: Judgement and insight appear impaired.     Data Reviewed: I have personally reviewed following labs and imaging studies  CBC: Recent Labs  Lab 09/02/20 2045 09/02/20 2058 09/03/20 0600 09/05/20 0807  WBC 8.8  --  5.9 5.6  NEUTROABS 5.7  --   --  3.0  HGB 11.5* 12.6* 11.8* 9.4*  HCT 32.3* 37.0* 33.0* 26.5*  MCV 97.3  --  94.8 96.0  PLT 387  --  417* 362   Basic Metabolic Panel: Recent Labs  Lab 09/03/20 1631 09/04/20 0044 09/04/20 0641 09/05/20 0807 09/06/20 0526  NA 121* 122* 121* 121* 122*  K 3.5 3.6 3.8 3.4* 3.7  CL 88* 89* 88* 91* 92*  CO2 23 24 22 23 23   GLUCOSE 107* 90 88 108* 108*  BUN 11 13 16 14 11   CREATININE 0.63 0.94 0.96 0.74 0.67  CALCIUM 10.3 10.0 9.8 9.5 10.0   GFR: Estimated Creatinine Clearance: 91.8 mL/min (by C-G formula based on SCr of 0.67 mg/dL). Liver Function Tests: Recent Labs  Lab 09/02/20 2045  AST 25  ALT 18  ALKPHOS 90  BILITOT 1.1  PROT 7.0  ALBUMIN 4.0   No results for input(s): LIPASE, AMYLASE in the last 168 hours. Recent Labs  Lab 09/02/20 2045  AMMONIA 15   Coagulation Profile: Recent Labs  Lab 09/02/20 2215  INR 1.1   Cardiac Enzymes: No results for input(s): CKTOTAL, CKMB, CKMBINDEX, TROPONINI in the last 168 hours. BNP (last 3 results) No results for input(s): PROBNP in the last 8760 hours. HbA1C: No results for input(s): HGBA1C in the last 72 hours. CBG: Recent Labs  Lab 09/05/20 1128 09/05/20 1643 09/05/20 2018 09/05/20 2355 09/06/20 0353  GLUCAP 113* 99 89 81 102*   Lipid Profile: No results for input(s): CHOL, HDL, LDLCALC, TRIG,  CHOLHDL, LDLDIRECT in the last 72 hours. Thyroid Function Tests: No results for input(s): TSH, T4TOTAL, FREET4, T3FREE, THYROIDAB in the last 72 hours. Anemia Panel: No results for input(s): VITAMINB12, FOLATE, FERRITIN, TIBC, IRON, RETICCTPCT in the last 72 hours. Sepsis Labs: No results for input(s): PROCALCITON, LATICACIDVEN in the last 168 hours.  Recent Results (from the past 240 hour(s))  Resp Panel by RT-PCR (Flu A&B, Covid) Nasopharyngeal Swab     Status: None   Collection Time: 09/02/20  9:13 PM   Specimen: Nasopharyngeal Swab; Nasopharyngeal(NP) swabs in vial transport medium  Result Value Ref Range Status   SARS Coronavirus 2 by RT PCR NEGATIVE NEGATIVE Final    Comment: (NOTE) SARS-CoV-2 target nucleic acids are NOT DETECTED.  The SARS-CoV-2 RNA  is generally detectable in upper respiratory specimens during the acute phase of infection. The lowest concentration of SARS-CoV-2 viral copies this assay can detect is 138 copies/mL. A negative result does not preclude SARS-Cov-2 infection and should not be used as the sole basis for treatment or other patient management decisions. A negative result may occur with  improper specimen collection/handling, submission of specimen other than nasopharyngeal swab, presence of viral mutation(s) within the areas targeted by this assay, and inadequate number of viral copies(<138 copies/mL). A negative result must be combined with clinical observations, patient history, and epidemiological information. The expected result is Negative.  Fact Sheet for Patients:  BloggerCourse.com  Fact Sheet for Healthcare Providers:  SeriousBroker.it  This test is no t yet approved or cleared by the [place] FDA and  has been authorized for detection and/or diagnosis of SARS-CoV-2 by FDA under an Emergency Use Authorization (EUA). This EUA will remain  in effect (meaning this test can be used) for  the duration of the COVID-19 declaration under Section 564(b)(1) of the Act, 21 U.S.C.section 360bbb-3(b)(1), unless the authorization is terminated  or revoked sooner.       Influenza A by PCR NEGATIVE NEGATIVE Final   Influenza B by PCR NEGATIVE NEGATIVE Final    Comment: (NOTE) The Xpert Xpress SARS-CoV-2/FLU/RSV plus assay is intended as an aid in the diagnosis of influenza from Nasopharyngeal swab specimens and should not be used as a sole basis for treatment. Nasal washings and aspirates are unacceptable for Xpert Xpress SARS-CoV-2/FLU/RSV testing.  Fact Sheet for Patients: BloggerCourse.com  Fact Sheet for Healthcare Providers: SeriousBroker.it  This test is not yet approved or cleared by the [place] FDA and has been authorized for detection and/or diagnosis of SARS-CoV-2 by FDA under an Emergency Use Authorization (EUA). This EUA will remain in effect (meaning this test can be used) for the duration of the COVID-19 declaration under Section 564(b)(1) of the Act, 21 U.S.C. section 360bbb-3(b)(1), unless the authorization is terminated or revoked.  Performed at [facility], [address], [place], [place] [address]          Radiology Studies: No results found.      Scheduled Meds: . carvedilol  12.5 mg Oral BID WC  . clonazePAM  1 mg Oral QHS  . enoxaparin (LOVENOX) injection  40 mg Subcutaneous Q24H  . finasteride  5 mg Oral Daily  . furosemide  20 mg Oral Daily  . insulin aspart  0-9 Units Subcutaneous Q4H  . melatonin  5 mg Oral QHS  . risperiDONE  4 mg Oral Daily  . sodium chloride  2 g Oral BID WC  . tamsulosin  0.4 mg Oral Daily  . traZODone  150 mg Oral QHS   Continuous Infusions:    LOS: 3 days    Time spent: 35 mins.More than 50% of that time was spent in counseling and/or coordination of care.      Burnadette Pop, MD [facility] P1/07/2021, 7:37  AM

## 2020-09-06 NOTE — NC FL2 (Addendum)
[place] MEDICAID FL2 LEVEL OF CARE SCREENING TOOL     IDENTIFICATION  Patient Name: Connor Franklin Birthdate: 12-17-1960 Sex: male Admission Date (Current Location): 09/02/2020  [facility] and [place] Number:  Producer, television/film/video and Address:  [facility],  [address], [place] [address]      Provider Number: [id number]  Attending Physician Name and Address:  Burnadette Pop, MD  Relative Name and Phone Number:  Eliga, Arvie Mother [phone]    Current Level of Care: Other (Comment) (ALF) Recommended Level of Care: Assisted Living Facility Prior Approval Number:    Date Approved/Denied:   PASRR Number:    Discharge Plan: ALF    Current Diagnoses: Patient Active Problem List   Diagnosis Date Noted  . Hyponatremia 09/03/2020    Orientation RESPIRATION BLADDER Height & Weight     Self,Place,Time  Normal Incontinent Weight: 167 lb 12.8 oz (76.1 kg) Height:  5\' 7"  (170.2 cm)  BEHAVIORAL SYMPTOMS/MOOD NEUROLOGICAL BOWEL NUTRITION STATUS      Incontinent Diet (Carb Modified)  AMBULATORY STATUS COMMUNICATION OF NEEDS Skin   Supervision  Verbally Normal                       Personal Care Assistance Level of Assistance  Bathing,Feeding,Dressing Bathing Assistance: Limited assistance Feeding assistance: Limited assistance Dressing Assistance: Limited assistance     Functional Limitations Info  Sight,Hearing,Speech Sight Info: Adequate Hearing Info: Adequate Speech Info: Adequate    SPECIAL CARE FACTORS FREQUENCY  PT (By licensed PT),OT (By licensed OT)     PT Frequency: 5x/week OT Frequency: 5X/week            Contractures Contractures Info: Not present    Additional Factors Info  Code Status,Allergies Code Status Info: DNR Allergies Info: Allergies: Elavil (Amitriptyline Hcl), Sulfa Antibiotics           Current Medications (09/06/2020):  Allergies as of 09/07/2020      Reactions   Elavil [amitriptyline  Hcl]    Sulfa Antibiotics          Medication List    STOP taking these medications   divalproex 250 MG DR tablet Commonly known as: DEPAKOTE   haloperidol 1 MG tablet Commonly known as: HALDOL     TAKE these medications   acetaminophen 500 MG tablet Commonly known as: TYLENOL Take 1,000 mg by mouth in the morning, at noon, and at bedtime.   carvedilol 12.5 MG tablet Commonly known as: COREG Take 12.5 mg by mouth 2 (two) times daily with a meal.   clonazePAM 1 MG tablet Commonly known as: KLONOPIN Take 1 mg by mouth at bedtime.   docusate sodium 100 MG capsule Commonly known as: COLACE Take 200 mg by mouth at bedtime.   finasteride 5 MG tablet Commonly known as: PROSCAR Take 5 mg by mouth daily.   lactulose 10 GM/15ML solution Commonly known as: CHRONULAC Take 20 g by mouth daily.   melatonin 5 MG Tabs Take 5 mg by mouth at bedtime.   nitroGLYCERIN 0.4 MG SL tablet Commonly known as: NITROSTAT Place 0.4 mg under the tongue every 5 (five) minutes as needed for chest pain.   risperiDONE 2 MG tablet Commonly known as: RISPERDAL Take 4 mg by mouth in the morning.   sodium chloride 1 g tablet Take 2 tablets (2 g total) by mouth 2 (two) times daily with a meal for 7 days. What changed:   how much to take  when to  take this  Another medication with the same name was removed. Continue taking this medication, and follow the directions you see here.   tamsulosin 0.4 MG Caps capsule Commonly known as: FLOMAX Take 0.4 mg by mouth in the morning.   traZODone 150 MG tablet Commonly known as: DESYREL Take 150 mg by mouth at bedtime.           Contact information for follow-up providers        Hague, Myrene Galas, MD. Schedule an appointment as soon as possible for a visit in 1 week(s).   Specialty: Internal Medicine Contact information: [address] [place] [address] [phone]        Discharge Medications: Please  see discharge summary for a list of discharge medications.  Relevant Imaging Results:  Relevant Lab Results:   Additional Information ssn:[id number]  Clearance Coots, LCSW

## 2020-09-06 NOTE — TOC Progression Note (Signed)
Transition of Care [facility]) - Progression Note    Patient Details  Name: Connor Franklin MRN: [id number] Date of Birth: 1961/03/27  Transition of Care [facility]) CM/SW Contact  Clearance Coots, LCSW Phone Number: 09/06/2020, 2:56 PM  Clinical Narrative:    CSW sent physical therapy notes, FL2 for review  Anticipate d/c tomorrow  Expected Discharge Plan: Assisted Living Barriers to Discharge: Continued Medical Work up  Expected Discharge Plan and Services Expected Discharge Plan: Assisted Living In-house Referral: Clinical Social Work Discharge Planning Services: CM Consult   Living arrangements for the past 2 months: Assisted Living Facility                                       Social Determinants of Health (SDOH) Interventions    Readmission Risk Interventions No flowsheet data found.

## 2020-09-07 LAB — BASIC METABOLIC PANEL
Anion gap: 9 (ref 5–15)
BUN: 7 mg/dL (ref 6–20)
CO2: 24 mmol/L (ref 22–32)
Calcium: 10.3 mg/dL (ref 8.9–10.3)
Chloride: 93 mmol/L — ABNORMAL LOW (ref 98–111)
Creatinine, Ser: 0.68 mg/dL (ref 0.61–1.24)
GFR, Estimated: >60 mL/min (ref >60)
Glucose, Bld: 128 mg/dL — ABNORMAL HIGH (ref 70–99)
Potassium: 3.7 mmol/L (ref 3.5–5.1)
Sodium: 126 mmol/L — ABNORMAL LOW (ref 135–145)

## 2020-09-07 LAB — GLUCOSE, CAPILLARY
Glucose-Capillary: 103 mg/dL — ABNORMAL HIGH (ref 70–99)
Glucose-Capillary: 106 mg/dL — ABNORMAL HIGH (ref 70–99)
Glucose-Capillary: 114 mg/dL — ABNORMAL HIGH (ref 70–99)
Glucose-Capillary: 123 mg/dL — ABNORMAL HIGH (ref 70–99)
Glucose-Capillary: 126 mg/dL — ABNORMAL HIGH (ref 70–99)
Glucose-Capillary: 143 mg/dL — ABNORMAL HIGH (ref 70–99)

## 2020-09-07 LAB — SARS CORONAVIRUS 2 (TAT 6-24 HRS): SARS Coronavirus 2: NEGATIVE

## 2020-09-07 MED ORDER — SODIUM CHLORIDE 1 G PO TABS: 2.0000 g | ORAL_TABLET | Freq: Two times a day (BID) | ORAL | 0 refills | Status: AC

## 2020-09-07 NOTE — Plan of Care (Signed)
  Problem: Education: Goal: Knowledge of General Education information will improve Description: Including pain rating scale, medication(s)/side effects and non-pharmacologic comfort measures Outcome: Progressing   Problem: Health Behavior/Discharge Planning: Goal: Ability to manage health-related needs will improve Outcome: Progressing   Problem: Clinical Measurements: Goal: Ability to maintain clinical measurements within normal limits will improve Outcome: Progressing Goal: Will remain free from infection Outcome: Progressing   Problem: Activity: Goal: Risk for activity intolerance will decrease Outcome: Progressing   Problem: Nutrition: Goal: Adequate nutrition will be maintained Outcome: Progressing   Problem: Coping: Goal: Level of anxiety will decrease Outcome: Progressing   Problem: Elimination: Goal: Will not experience complications related to bowel motility Outcome: Progressing   Problem: Pain Managment: Goal: General experience of comfort will improve Outcome: Progressing   Problem: Safety: Goal: Ability to remain free from injury will improve Outcome: Progressing   Problem: Skin Integrity: Goal: Risk for impaired skin integrity will decrease Outcome: Progressing   Problem: Safety: Goal: Non-violent Restraint(s) Outcome: Progressing

## 2020-09-07 NOTE — Progress Notes (Signed)
[facility] Progress Note  Subjective: Na+ up to 126  Vitals:   09/06/20 2200 09/07/20 0543 09/07/20 0558 09/07/20 1339  BP: (!) 149/84 (!) 162/95 (!) 157/98 (!) 179/118  Pulse: 66 76 81 (!) 105  Resp: 18 18  (!) 21  Temp: (!) 97.5 F (36.4 C) 98.6 F (37 C)  97.6 F (36.4 C)  TempSrc: Oral Oral  Oral  SpO2: 100% 100% 100% 100%  Weight:      Height:        Exam: Gen disheveled, pleasant man No rash, cyanosis or gangrene Sclera anicteric, throat clear, mouth slightly dry  No jvd or bruits Chest clear bilat to bases RRR no MRG Abd soft ntnd no mass or ascites +bs GU normal male  MS no joint effusions or deformity Ext no UE or LE edema, no wounds or ulcers Neuro is alert, Ox 3 , nf, mild gen'd weakness    Home meds:  - coreg 12.5 bid/ sl ntg prn  - haldol 3mg  bid / depakote 750 mg hs/ trazodone 150 hs/ risperdal 4 mg hs/ clonazepam 1 mg hs  - proscar 5 mg qd/ lactulose 20 gm qd/ salt tabs 1 gm qd/ flomax 0.4 qd    Labs showed >>   UNa 40, UOsm 117    UA is negative    Am cortisol 13.3, wnl    TSH  0.723 iIU/ml   BP are wnl 125/ 85, HR 60's  RR normal, afeb    CXR 1/08 - IMPRESSION: No active disease.    Assessment/ Plan: 1. Hyponatremia - Na+ 113 on admission, up to 122 today, treated mostly w/ isotonic fluids here. Uosm is low and UNa normal, looks dry to euvolemic on exam. Normal TSH/ cortisol.  I don't see any signs of excessive fluid intake. With normal saline IVF"s Na+improved up to 126 today.  OK for dc. Would send out of 5-7 days of salt tabs 2 gm bid, then dc. Would not keep him on long-term salt tabs, unless an MD is monitoring. Will get appt for f/u for him at [facility] 4-5 weeks. Will sign off.  2. H/o schizoaffective and bipolar d/o 3. Depression     Rob Naeem Quillin 09/07/2020, 5:31 PM   Recent Labs  Lab 09/03/20 0600 09/03/20 1631 09/05/20 0807 09/06/20 0526 09/07/20 0934  K  --    < > 3.4* 3.7 3.7  BUN  --    < > 14 11 7    CREATININE  --    < > 0.74 0.67 0.68  CALCIUM  --    < > 9.5 10.0 10.3  HGB 11.8*  --  9.4*  --   --    < > = values in this interval not displayed.   Inpatient medications: . carvedilol  12.5 mg Oral BID WC  . clonazePAM  1 mg Oral QHS  . enoxaparin (LOVENOX) injection  40 mg Subcutaneous Q24H  . finasteride  5 mg Oral Daily  . insulin aspart  0-9 Units Subcutaneous Q4H  . melatonin  5 mg Oral QHS  . nicotine  14 mg Transdermal Daily  . risperiDONE  4 mg Oral Daily  . tamsulosin  0.4 mg Oral Daily  . traZODone  150 mg Oral QHS   . sodium chloride 100 mL/hr at 09/07/20 1350   docusate sodium, haloperidol lactate, ondansetron (ZOFRAN) IV, polyethylene glycol

## 2020-09-07 NOTE — TOC Transition Note (Signed)
Transition of Care [facility]) - CM/SW Discharge Note   Patient Details  Name: Connor Franklin MRN: [id number] Date of Birth: [birth year]  [facility] Care [facility]) CM/SW Contact:  Clearance Coots, LCSW Phone Number: 09/07/2020, 11:51 AM   Clinical Narrative:    CSW faxed D/C summary, FL2 and PT notes to ALF- [facility] of [address] Supervisor [employer]. Erin to review.  CSW notified the facility of the patient covid test on 1/8. They have requested a new test be given.  Physician ordered the covid test. Waiting for the test to result CSW notified the patient mother. [facility] toi transport   Final next level of care: Home w Home Health Services Barriers to Discharge: Other (comment) (Pending covid test)   Patient Goals and CMS Choice        Discharge Placement                Patient to be transferred to facility by: [facility] Name of family member notified: Raider, [place] Mother [phone] Patient and family notified of of transfer: 09/07/20  Discharge Plan and Services In-house Referral: Clinical Social Work Discharge Planning Services: CM Consult                                 Social Determinants of Health (SDOH) Interventions     Readmission Risk Interventions No flowsheet data found.

## 2020-09-07 NOTE — Discharge Summary (Addendum)
Physician Discharge Summary  Connor Franklin ZOX:[id number] DOB: 31-May-1961 DOA: 09/02/2020  PCP: Galvin ProfferHague, Imran P, MD  Admit date: 09/02/2020 Discharge date: 09/08/2020  Admitted From: Home Disposition:  Home  Discharge Condition:Stable CODE STATUS: DNR Diet recommendation: Heart Healthy   Brief/Interim Summary:  Patient is a 60 year old male with history of bipolar disorder, schizoaffective  disorder , depression, diabetes, GERD who was brought to the emergency department from nursing facility with complaints of altered mental status.  Lab work showed sodium of 112 in the emergency department.  Patient was admitted for further management of hyponatremia/altered mental status.  He has history of chronic hyponatremia.  CT head did not show any acute intracranial abnormality.  Hospital course also remarkable for agitation.  Today he remains completely alert and oriented.Na has improved to the level of baseline.  Plan for transfer back to his  facility today.  Following problems were addressed during his hospitalization:   Altered mental status: CT head did not show any acute intracranial abnormalities.  Ammonia level is normal.  Thought to be secondary to hyponatremia. he was on multiple psychiatric medications at nursing facility. As per the mother, he is alert and oriented at baseline. He became agitated in the evening of 09/05/2020 and restraints have to be applied.  This morning he remains completely alert and oriented.    Hyponatremia: As per the previous lab works, he has chronic hyponatremia and his sodium ranges from 110-130.  . TSH, cortisol normal.As per his mom,he drinks a lot of water. Nephrology was consulted during this hospitalization.  His urine sodium was low so volume depletion was suspected and was started on IV fluids.  Sodium level is 126 today.  He will be discharged on 7 days course of salt tablets.  We recommend to check BMP in a week. I have requested nephrology to  make arrangement for  follow-up as an outpatient  History of BPH: On Proscar, tamsulosin .  History of bipolar disorder/shizoeffective  disorder: On clonazepam, risperidone, trazodone at nursing facility.  These medications have been resumed. He needs to follow-up with his psychiatrist as an outpatient  Hypertension: On Coreg.  His remained hypertensive during this   hospitalization.Added amlodipine    Discharge Diagnoses:  Active Problems:   Hyponatremia    Discharge Instructions  Discharge Instructions    Diet - low sodium heart healthy   Complete by: As directed    Discharge instructions   Complete by: As directed    1)Please take prescribed medications as instructed 2)Follow up with your PCP in a week. Do a BMP test during the follow up to check sodium level.   Increase activity slowly   Complete by: As directed      Allergies as of 09/08/2020      Reactions   Elavil [amitriptyline Hcl]    Sulfa Antibiotics       Medication List    STOP taking these medications   divalproex 250 MG DR tablet Commonly known as: DEPAKOTE   haloperidol 1 MG tablet Commonly known as: HALDOL     TAKE these medications   acetaminophen 500 MG tablet Commonly known as: TYLENOL Take 1,000 mg by mouth in the morning, at noon, and at bedtime.   amLODipine 10 MG tablet Commonly known as: NORVASC Take 1 tablet (10 mg total) by mouth daily.   carvedilol 12.5 MG tablet Commonly known as: COREG Take 12.5 mg by mouth 2 (two) times daily with a meal.   clonazePAM 1 MG tablet  Commonly known as: KLONOPIN Take 1 mg by mouth at bedtime.   docusate sodium 100 MG capsule Commonly known as: COLACE Take 200 mg by mouth at bedtime.   finasteride 5 MG tablet Commonly known as: PROSCAR Take 5 mg by mouth daily.   lactulose 10 GM/15ML solution Commonly known as: CHRONULAC Take 20 g by mouth daily.   melatonin 5 MG Tabs Take 5 mg by mouth at bedtime.   nitroGLYCERIN 0.4 MG SL  tablet Commonly known as: NITROSTAT Place 0.4 mg under the tongue every 5 (five) minutes as needed for chest pain.   risperiDONE 2 MG tablet Commonly known as: RISPERDAL Take 4 mg by mouth in the morning.   sodium chloride 1 g tablet Take 2 tablets (2 g total) by mouth 2 (two) times daily with a meal for 7 days. What changed:   how much to take  when to take this  Another medication with the same name was removed. Continue taking this medication, and follow the directions you see here.   tamsulosin 0.4 MG Caps capsule Commonly known as: FLOMAX Take 0.4 mg by mouth in the morning.   traZODone 150 MG tablet Commonly known as: DESYREL Take 150 mg by mouth at bedtime.       Contact information for follow-up providers    Hague, Myrene Galas, MD. Schedule an appointment as soon as possible for a visit in 1 week(s).   Specialty: Internal Medicine Contact information: [address] [place] [address] [phone]            Contact information for after-discharge care    Destination    [facility] .   Service: Assisted Living Contact information: [address] [place] [address] [phone]                 Allergies  Allergen Reactions  . Elavil [Amitriptyline Hcl]   . Sulfa Antibiotics     Consultations:  Nephrology,PCCM   Procedures/Studies: CT Head Wo Contrast  Result Date: 09/02/2020 CLINICAL DATA:  Altered mental status. EXAM: CT HEAD WITHOUT CONTRAST TECHNIQUE: Contiguous axial images were obtained from the base of the skull through the vertex without intravenous contrast. COMPARISON:  Head CT 05/09/2012 FINDINGS: Brain: Age related atrophy. There is mild periventricular and deep white matter hypodensity, nonspecific but typical of chronic small vessel ischemia. No intracranial hemorrhage, mass effect, or midline shift. No hydrocephalus. The basilar cisterns are patent. Small remote medial left occipital infarct  unchanged. No evidence of territorial infarct or acute ischemia. No extra-axial or intracranial fluid collection. Vascular: No hyperdense vessel.  Skull base atherosclerosis. Skull: No fracture or focal lesion. Sinuses/Orbits: Mucous retention cysts in the left maxillary sinus. No acute findings. Mastoid air cells are clear. Other: None. IMPRESSION: 1. No acute intracranial abnormality. 2. Age related atrophy and chronic small vessel ischemia. Small remote left occipital infarct. Electronically Signed   By: Narda Rutherford M.D.   On: 09/02/2020 21:34   DG CHEST PORT 1 VIEW  Result Date: 09/02/2020 CLINICAL DATA:  Hyponatremia. EXAM: PORTABLE CHEST 1 VIEW COMPARISON:  06/02/2000 FINDINGS: The heart size and mediastinal contours are within normal limits. Both lungs are clear. The visualized skeletal structures are unremarkable. IMPRESSION: No active disease. Electronically Signed   By: Katherine Mantle M.D.   On: 09/02/2020 22:16      Subjective:   Discharge Exam: Vitals:   09/07/20 2028 09/08/20 0519  BP: (!) 157/92 (!) 172/83  Pulse: (!) 108 96  Resp: 20 18  Temp: 99 F (37.2 C) (!) 97.5 F (36.4 C)  SpO2: 100% 100%   Vitals:   09/07/20 0558 09/07/20 1339 09/07/20 2028 09/08/20 0519  BP: (!) 157/98 (!) 179/118 (!) 157/92 (!) 172/83  Pulse: 81 (!) 105 (!) 108 96  Resp:  (!) 21 20 18   Temp:  97.6 F (36.4 C) 99 F (37.2 C) (!) 97.5 F (36.4 C)  TempSrc:  Oral Oral Oral  SpO2: 100% 100% 100% 100%  Weight:      Height:        General: Pt is alert, awake, not in acute distress Cardiovascular: RRR, S1/S2 +, no rubs, no gallops Respiratory: CTA bilaterally, no wheezing, no rhonchi Abdominal: Soft, NT, ND, bowel sounds + Extremities: no edema, no cyanosis    The results of significant diagnostics from this hospitalization (including imaging, microbiology, ancillary and laboratory) are listed below for reference.     Microbiology: Recent Results (from the past 240 hour(s))   Resp Panel by RT-PCR (Flu A&B, Covid) Nasopharyngeal Swab     Status: None   Collection Time: 09/02/20  9:13 PM   Specimen: Nasopharyngeal Swab; Nasopharyngeal(NP) swabs in vial transport medium  Result Value Ref Range Status   SARS Coronavirus 2 by RT PCR NEGATIVE NEGATIVE Final    Comment: (NOTE) SARS-CoV-2 target nucleic acids are NOT DETECTED.  The SARS-CoV-2 RNA is generally detectable in upper respiratory specimens during the acute phase of infection. The lowest concentration of SARS-CoV-2 viral copies this assay can detect is 138 copies/mL. A negative result does not preclude SARS-Cov-2 infection and should not be used as the sole basis for treatment or other patient management decisions. A negative result may occur with  improper specimen collection/handling, submission of specimen other than nasopharyngeal swab, presence of viral mutation(s) within the areas targeted by this assay, and inadequate number of viral copies(<138 copies/mL). A negative result must be combined with clinical observations, patient history, and epidemiological information. The expected result is Negative.  Fact Sheet for Patients:  10/31/20  Fact Sheet for Healthcare Providers:  BloggerCourse.com  This test is no t yet approved or cleared by the [place] FDA and  has been authorized for detection and/or diagnosis of SARS-CoV-2 by FDA under an Emergency Use Authorization (EUA). This EUA will remain  in effect (meaning this test can be used) for the duration of the COVID-19 declaration under Section 564(b)(1) of the Act, 21 U.S.C.section 360bbb-3(b)(1), unless the authorization is terminated  or revoked sooner.       Influenza A by PCR NEGATIVE NEGATIVE Final   Influenza B by PCR NEGATIVE NEGATIVE Final    Comment: (NOTE) The Xpert Xpress SARS-CoV-2/FLU/RSV plus assay is intended as an aid in the diagnosis of influenza from  Nasopharyngeal swab specimens and should not be used as a sole basis for treatment. Nasal washings and aspirates are unacceptable for Xpert Xpress SARS-CoV-2/FLU/RSV testing.  Fact Sheet for Patients: [place]  Fact Sheet for Healthcare Providers: BloggerCourse.com  This test is not yet approved or cleared by the [place] FDA and has been authorized for detection and/or diagnosis of SARS-CoV-2 by FDA under an Emergency Use Authorization (EUA). This EUA will remain in effect (meaning this test can be used) for the duration of the COVID-19 declaration under Section 564(b)(1) of the Act, 21 U.S.C. section 360bbb-3(b)(1), unless the authorization is terminated or revoked.  Performed at [facility], [address], [place], [place]   SARS CORONAVIRUS 2 (  TAT 6-24 HRS) Nasopharyngeal Nasopharyngeal Swab     Status: None   Collection Time: 09/07/20 11:14 AM   Specimen: Nasopharyngeal Swab  Result Value Ref Range Status   SARS Coronavirus 2 NEGATIVE NEGATIVE Final    Comment: (NOTE) SARS-CoV-2 target nucleic acids are NOT DETECTED.  The SARS-CoV-2 RNA is generally detectable in upper and lower respiratory specimens during the acute phase of infection. Negative results do not preclude SARS-CoV-2 infection, do not rule out co-infections with other pathogens, and should not be used as the sole basis for treatment or other patient management decisions. Negative results must be combined with clinical observations, patient history, and epidemiological information. The expected result is Negative.  Fact Sheet for Patients: HairSlick.nohttps://www.fda.gov/media/138098/download  Fact Sheet for Healthcare Providers: quierodirigir.comhttps://www.fda.gov/media/138095/download  This test is not yet approved or cleared by the [place] FDA and  has been authorized for detection and/or diagnosis of SARS-CoV-2 by FDA under an  Emergency Use Authorization (EUA). This EUA will remain  in effect (meaning this test can be used) for the duration of the COVID-19 declaration under Se ction 564(b)(1) of the Act, 21 U.S.C. section 360bbb-3(b)(1), unless the authorization is terminated or revoked sooner.  Performed at [facility], [address] [place], [place] [address]      Labs: BNP (last 3 results) No results for input(s): BNP in the last 8760 hours. Basic Metabolic Panel: Recent Labs  Lab 09/04/20 0044 09/04/20 0641 09/05/20 0807 09/06/20 0526 09/06/20 1936 09/07/20 0934  NA 122* 121* 121* 122* 124* 126*  K 3.6 3.8 3.4* 3.7  --  3.7  CL 89* 88* 91* 92*  --  93*  CO2 24 22 23 23   --  24  GLUCOSE 90 88 108* 108*  --  128*  BUN 13 16 14 11   --  7  CREATININE 0.94 0.96 0.74 0.67  --  0.68  CALCIUM 10.0 9.8 9.5 10.0  --  10.3   Liver Function Tests: Recent Labs  Lab 09/02/20 2045  AST 25  ALT 18  ALKPHOS 90  BILITOT 1.1  PROT 7.0  ALBUMIN 4.0   No results for input(s): LIPASE, AMYLASE in the last 168 hours. Recent Labs  Lab 09/02/20 2045  AMMONIA 15   CBC: Recent Labs  Lab 09/02/20 2045 09/02/20 2058 09/03/20 0600 09/05/20 0807  WBC 8.8  --  5.9 5.6  NEUTROABS 5.7  --   --  3.0  HGB 11.5* 12.6* 11.8* 9.4*  HCT 32.3* 37.0* 33.0* 26.5*  MCV 97.3  --  94.8 96.0  PLT 387  --  417* 362   Cardiac Enzymes: No results for input(s): CKTOTAL, CKMB, CKMBINDEX, TROPONINI in the last 168 hours. BNP: Invalid input(s): POCBNP CBG: Recent Labs  Lab 09/07/20 1156 09/07/20 1601 09/07/20 2012 09/08/20 0029 09/08/20 0400  GLUCAP 143* 106* 103* 105* 90   D-Dimer No results for input(s): DDIMER in the last 72 hours. Hgb A1c No results for input(s): HGBA1C in the last 72 hours. Lipid Profile No results for input(s): CHOL, HDL, LDLCALC, TRIG, CHOLHDL, LDLDIRECT in the last 72 hours. Thyroid function studies No results for input(s): TSH, T4TOTAL, T3FREE, THYROIDAB in the last 72  hours.  Invalid input(s): FREET3 Anemia work up No results for input(s): VITAMINB12, FOLATE, FERRITIN, TIBC, IRON, RETICCTPCT in the last 72 hours. Urinalysis    Component Value Date/Time   COLORURINE COLORLESS (A) 09/02/2020 2016   APPEARANCEUR CLEAR 09/02/2020 2016   LABSPEC 1.002 (L) 09/02/2020 2016  PHURINE 7.0 09/02/2020 2016   GLUCOSEU 50 (A) 09/02/2020 2016   HGBUR SMALL (A) 09/02/2020 2016   BILIRUBINUR NEGATIVE 09/02/2020 2016   KETONESUR NEGATIVE 09/02/2020 2016   PROTEINUR NEGATIVE 09/02/2020 2016   NITRITE NEGATIVE 09/02/2020 2016   LEUKOCYTESUR NEGATIVE 09/02/2020 2016   Sepsis Labs Invalid input(s): PROCALCITONIN,  WBC,  LACTICIDVEN Microbiology Recent Results (from the past 240 hour(s))  Resp Panel by RT-PCR (Flu A&B, Covid) Nasopharyngeal Swab     Status: None   Collection Time: 09/02/20  9:13 PM   Specimen: Nasopharyngeal Swab; Nasopharyngeal(NP) swabs in vial transport medium  Result Value Ref Range Status   SARS Coronavirus 2 by RT PCR NEGATIVE NEGATIVE Final    Comment: (NOTE) SARS-CoV-2 target nucleic acids are NOT DETECTED.  The SARS-CoV-2 RNA is generally detectable in upper respiratory specimens during the acute phase of infection. The lowest concentration of SARS-CoV-2 viral copies this assay can detect is 138 copies/mL. A negative result does not preclude SARS-Cov-2 infection and should not be used as the sole basis for treatment or other patient management decisions. A negative result may occur with  improper specimen collection/handling, submission of specimen other than nasopharyngeal swab, presence of viral mutation(s) within the areas targeted by this assay, and inadequate number of viral copies(<138 copies/mL). A negative result must be combined with clinical observations, patient history, and epidemiological information. The expected result is Negative.  Fact Sheet for Patients:  BloggerCourse.com  Fact Sheet  for Healthcare Providers:  SeriousBroker.it  This test is no t yet approved or cleared by the [place] FDA and  has been authorized for detection and/or diagnosis of SARS-CoV-2 by FDA under an Emergency Use Authorization (EUA). This EUA will remain  in effect (meaning this test can be used) for the duration of the COVID-19 declaration under Section 564(b)(1) of the Act, 21 U.S.C.section 360bbb-3(b)(1), unless the authorization is terminated  or revoked sooner.       Influenza A by PCR NEGATIVE NEGATIVE Final   Influenza B by PCR NEGATIVE NEGATIVE Final    Comment: (NOTE) The Xpert Xpress SARS-CoV-2/FLU/RSV plus assay is intended as an aid in the diagnosis of influenza from Nasopharyngeal swab specimens and should not be used as a sole basis for treatment. Nasal washings and aspirates are unacceptable for Xpert Xpress SARS-CoV-2/FLU/RSV testing.  Fact Sheet for Patients: BloggerCourse.com  Fact Sheet for Healthcare Providers: SeriousBroker.it  This test is not yet approved or cleared by the [place] FDA and has been authorized for detection and/or diagnosis of SARS-CoV-2 by FDA under an Emergency Use Authorization (EUA). This EUA will remain in effect (meaning this test can be used) for the duration of the COVID-19 declaration under Section 564(b)(1) of the Act, 21 U.S.C. section 360bbb-3(b)(1), unless the authorization is terminated or revoked.  Performed at [facility], [address], [place], [place] [address]   SARS CORONAVIRUS 2 (TAT 6-24 HRS) Nasopharyngeal Nasopharyngeal Swab     Status: None   Collection Time: 09/07/20 11:14 AM   Specimen: Nasopharyngeal Swab  Result Value Ref Range Status   SARS Coronavirus 2 NEGATIVE NEGATIVE Final    Comment: (NOTE) SARS-CoV-2 target nucleic acids are NOT DETECTED.  The SARS-CoV-2 RNA is generally detectable in upper and  lower respiratory specimens during the acute phase of infection. Negative results do not preclude SARS-CoV-2 infection, do not rule out co-infections with other pathogens, and should not be used as the sole basis for treatment or other patient management decisions. Negative results  must be combined with clinical observations, patient history, and epidemiological information. The expected result is Negative.  Fact Sheet for Patients: HairSlick.no  Fact Sheet for Healthcare Providers: quierodirigir.com  This test is not yet approved or cleared by the [place] FDA and  has been authorized for detection and/or diagnosis of SARS-CoV-2 by FDA under an Emergency Use Authorization (EUA). This EUA will remain  in effect (meaning this test can be used) for the duration of the COVID-19 declaration under Se ction 564(b)(1) of the Act, 21 U.S.C. section 360bbb-3(b)(1), unless the authorization is terminated or revoked sooner.  Performed at [facility], [address] [place], [place] [address]     Please note: You were cared for by a hospitalist during your hospital stay. Once you are discharged, your primary care physician will handle any further medical issues. Please note that NO REFILLS for any discharge medications will be authorized once you are discharged, as it is imperative that you return to your primary care physician (or establish a relationship with a primary care physician if you do not have one) for your post hospital discharge needs so that they can reassess your need for medications and monitor your lab values.    Time coordinating discharge: 40 minutes  SIGNED:   Burnadette Pop, MD  [facility] 09/08/2020, 7:32 AM Pager [phone]  If 7PM-7AM, please contact night-coverage [email or web address] Password [email or web address]

## 2020-09-07 NOTE — Progress Notes (Addendum)
Upon getting report patient has been a yellow mews since  earlier today, VS rechecked at this time and patient back to green.

## 2020-09-08 ENCOUNTER — Inpatient Hospital Stay (HOSPITAL_COMMUNITY): Payer: Medicaid Other

## 2020-09-08 LAB — GLUCOSE, CAPILLARY
Glucose-Capillary: 105 mg/dL — ABNORMAL HIGH (ref 70–99)
Glucose-Capillary: 89 mg/dL (ref 70–99)
Glucose-Capillary: 90 mg/dL (ref 70–99)
Glucose-Capillary: 94 mg/dL (ref 70–99)

## 2020-09-08 MED ORDER — HYDROXYZINE HCL 50 MG/ML IM SOLN
50.0000 mg | Freq: Once | INTRAMUSCULAR | Status: AC
  Administered 2020-09-08: 50 mg via INTRAMUSCULAR
  Filled 2020-09-08: qty 1

## 2020-09-08 MED ORDER — AMLODIPINE BESYLATE 10 MG PO TABS
10.0000 mg | ORAL_TABLET | Freq: Every day | ORAL | Status: DC
  Administered 2020-09-08: 10 mg via ORAL
  Filled 2020-09-08: qty 1

## 2020-09-08 MED ORDER — AMLODIPINE BESYLATE 10 MG PO TABS: 10.0000 mg | ORAL_TABLET | Freq: Every day | ORAL | 1 refills | Status: AC

## 2020-09-08 NOTE — Progress Notes (Signed)
Report was given to RN at [facility] prior to transport.  All questions were answered.

## 2020-09-08 NOTE — Progress Notes (Signed)
Patient was picked up by [facility] and taken to [facility].  All paperwork including AVS was given to the [facility] transporter.

## 2020-09-08 NOTE — Progress Notes (Signed)
Patient seen and examined at the bedside this morning.  He was sleeping during my evaluation but hemodynamically stable.  He was not discharged yesterday because there was delay in getting the COVID test result.  Patient remains medically stable for discharge to the facility today.  We added amlodipine on his discharge medication list because of persistent hypertension otherwise no change in the medical management.  Discharge summary and orders are already in.TOC aware

## 2020-09-08 NOTE — TOC Transition Note (Signed)
Transition of Care [facility]) - CM/SW Discharge Note   Patient Details  Name: Connor Franklin MRN: [id number] Date of Birth: 05-04-1961  [facility] CM/SW Contact:  Clearance Coots, LCSW Phone Number: 09/08/2020, 12:04 PM   Clinical Narrative:    Updated D/C Summary faxed.  Facility coordinating the patient ride back to the facility.  Nurse call report to: [phone] [facility] transport arranged to transport the patient.  Covid test results faxed.     Final next level of care: Assisted Living Barriers to Discharge: Barriers Resolved   Patient Goals and CMS Choice        Discharge Placement                Patient to be transferred to facility by: [facility] Name of family member notified: Schylar, Wuebker Mother [phone] Patient and family notified of of transfer: 09/07/20  Discharge Plan and Services In-house Referral: Clinical Social Work Discharge Planning Services: CM Consult                                 Social Determinants of Health (SDOH) Interventions     Readmission Risk Interventions No flowsheet data found.

## 2020-09-08 NOTE — Progress Notes (Signed)
Patient is being combative and getting out of bed. IV Haldol was given earlier with increased agitation. He says he wants to go outside to smoke, nicotine patch was applied to right arm. NP Blount was paged.

## 2020-09-09 ENCOUNTER — Emergency Department (HOSPITAL_COMMUNITY): Payer: Medicaid Other

## 2020-09-09 ENCOUNTER — Emergency Department (HOSPITAL_COMMUNITY)
Admission: EM | Admit: 2020-09-09 | Discharge: 2020-09-10 | Disposition: A | Discharge status: Home / Self Care | Payer: Medicaid Other | Attending: Emergency Medicine | Admitting: Emergency Medicine

## 2020-09-09 DIAGNOSIS — R42 Dizziness and giddiness: Secondary | ICD-10-CM | POA: Diagnosis not present

## 2020-09-09 DIAGNOSIS — W19XXXA Unspecified fall, initial encounter: Secondary | ICD-10-CM | POA: Diagnosis not present

## 2020-09-09 DIAGNOSIS — N179 Acute kidney failure, unspecified: Secondary | ICD-10-CM | POA: Diagnosis not present

## 2020-09-09 DIAGNOSIS — F17210 Nicotine dependence, cigarettes, uncomplicated: Secondary | ICD-10-CM | POA: Diagnosis not present

## 2020-09-09 DIAGNOSIS — E860 Dehydration: Secondary | ICD-10-CM | POA: Insufficient documentation

## 2020-09-09 DIAGNOSIS — S0083XA Contusion of other part of head, initial encounter: Secondary | ICD-10-CM | POA: Diagnosis not present

## 2020-09-09 DIAGNOSIS — S0990XA Unspecified injury of head, initial encounter: Secondary | ICD-10-CM | POA: Diagnosis present

## 2020-09-09 LAB — PROTIME-INR
INR: 1.7 — ABNORMAL HIGH (ref 0.8–1.2)
Prothrombin Time: 19.4 seconds — ABNORMAL HIGH (ref 11.4–15.2)

## 2020-09-09 LAB — CBC
HCT: 30.9 % — ABNORMAL LOW (ref 39.0–52.0)
Hemoglobin: 10 g/dL — ABNORMAL LOW (ref 13.0–17.0)
MCH: 33.9 pg (ref 26.0–34.0)
MCHC: 32.4 g/dL (ref 30.0–36.0)
MCV: 104.7 fL — ABNORMAL HIGH (ref 80.0–100.0)
Platelets: 359 10*3/uL (ref 150–400)
RBC: 2.95 MIL/uL — ABNORMAL LOW (ref 4.22–5.81)
RDW: 14.3 % (ref 11.5–15.5)
WBC: 11.6 10*3/uL — ABNORMAL HIGH (ref 4.0–10.5)
nRBC: 0 % (ref 0.0–0.2)

## 2020-09-09 LAB — APTT: aPTT: 25 seconds (ref 24–36)

## 2020-09-09 MED ORDER — SODIUM CHLORIDE 0.9 % IV BOLUS
1000.0000 mL | Freq: Once | INTRAVENOUS | Status: AC
  Administered 2020-09-10: 1000 mL via INTRAVENOUS
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
## 2020-09-09 NOTE — ED Provider Notes (Signed)
[facility] Emergency Department Provider Note MRN:  [id number]  Arrival date & time: 09/10/20     Chief Complaint   Fall   History of Present Illness   Connor Franklin is a 60 y.o. year-old male with a history of schizophrenia presenting to the ED with chief complaint of fall.  Patient reports that he was standing outside smoking a cigarette and became dizzy and fell to the ground.  Struck his head, did not pass out.  Denies blood thinners.  Denies neck or back pain, denies having any chest pain or shortness of breath before or after the fall, no abdominal pain.  Recently admitted to the hospital for low sodium, endorses compliance with all of his medications.  Review of Systems  A complete 10 system review of systems was obtained and all systems are negative except as noted in the HPI and PMH.   Patient's Health History   Past medical history: GI bleeding, hyponatremia Social history: Smokes 3 cigarettes/day   No family history on file.  Social History   Socioeconomic History  . Marital status: Unknown    Spouse name: Not on file  . Number of children: Not on file  . Years of education: Not on file  . Highest education level: Not on file  Occupational History  . Not on file  Tobacco Use  . Smoking status: Not on file  . Smokeless tobacco: Not on file  Substance and Sexual Activity  . Alcohol use: Not on file  . Drug use: Not on file  . Sexual activity: Not on file  Other Topics Concern  . Not on file  Social History Narrative  . Not on file   Social Determinants of Health   Financial Resource Strain: Not on file  Food Insecurity: Not on file  Transportation Needs: Not on file  Physical Activity: Not on file  Stress: Not on file  Social Connections: Not on file  Intimate Partner Violence: Not on file     Physical Exam   Vitals:   09/10/20 0030 09/10/20 0115  BP: 129/79 133/77  Pulse: 70 71  Resp: 17 15  Temp:    SpO2: 100%  100%    CONSTITUTIONAL: Chronically ill-appearing, NAD NEURO:  Alert and oriented x 3, no focal deficits, speech is simple and slow EYES:  eyes equal and reactive ENT/NECK:  no LAD, no JVD CARDIO: Regular rate, well-perfused, normal S1 and S2 PULM:  CTAB no wheezing or rhonchi GI/GU:  normal bowel sounds, non-distended, non-tender MSK/SPINE:  No gross deformities, no edema SKIN:  no rash, atraumatic PSYCH:  Appropriate speech and behavior  *Additional and/or pertinent findings included in MDM below  Diagnostic and Interventional Summary    EKG Interpretation  Date/Time:  Sunday September 10 2020 01:12:54 EST Ventricular Rate:  72 PR Interval:    QRS Duration: 125 QT Interval:  427 QTC Calculation: 468 R Axis:   30 Text Interpretation: Sinus rhythm Short PR interval LAE, consider biatrial enlargement IVCD, consider atypical LBBB Confirmed by Kennis Carina [id number]) on 09/10/2020 1:35:51 AM      Labs Reviewed  CBC - Abnormal; Notable for the following components:      Result Value   WBC 11.6 (*)    RBC 2.95 (*)    Hemoglobin 10.0 (*)    HCT 30.9 (*)    MCV 104.7 (*)    All other components within normal limits  COMPREHENSIVE METABOLIC PANEL - Abnormal; Notable for the following components:  CO2 20 (*)    Glucose, Bld 124 (*)    Creatinine, Ser 1.76 (*)    Calcium 10.7 (*)    Total Protein 6.1 (*)    Albumin 3.3 (*)    GFR, Estimated 44 (*)    All other components within normal limits  PROTIME-INR - Abnormal; Notable for the following components:   Prothrombin Time 19.4 (*)    INR 1.7 (*)    All other components within normal limits  APTT  TYPE AND SCREEN    CT HEAD WO CONTRAST  Final Result      Medications  sodium chloride 0.9 % bolus 1,000 mL (1,000 mLs Intravenous New Bag/Given 09/10/20 0042)     Procedures  /  Critical Care Procedures  ED Course and Medical Decision Making  I have reviewed the triage vital signs, the nursing notes, and pertinent  available records from the EMR.  Listed above are laboratory and imaging tests that I personally ordered, reviewed, and interpreted and then considered in my medical decision making (see below for details).  History of schizophrenia, difficult historian but seems to be alert and oriented and his speech reportedly at his baseline.  Dizziness causing a fall, has evidence of head trauma on exam, hematoma to the left forehead.  Otherwise nontraumatic exam.  Not on blood thinners.  Per chart review he was recently admitted for critically low sodium of 112.  Has also been having unintentional weight loss and GI bleeding for which she has deferred endoscopic or colonoscopy evaluation.  And so considering electrolyte disturbance, anemia.  Work-up pending.  Was reportedly hypotensive with EMS, here he is normotensive, well-perfused.     Labs are overall reassuring, appears to be mild AKI and so patient given liter bolus. Sodium is normal, no significant anemia. Patient remains well-appearing with normal vital signs, ambulates without issue, no further dizziness. Normal neurological exam, nothing to suggest CNS cause of dizziness, not exhibiting any vertigo symptoms. He is appropriate for discharge.  Elmer Sow. Pilar Plate, MD [facility] Emergency Medicine [facility] [email or web address]  Final Clinical Impressions(s) / ED Diagnoses     ICD-10-CM   1. Fall, initial encounter  W19.XXXA   2. Dizziness  R42   3. Traumatic hematoma of forehead, initial encounter  S00.83XA   4. Dehydration  E86.0   5. AKI (acute kidney injury) (HCC)  N17.9     ED Discharge Orders    None       Discharge Instructions Discussed with and Provided to Patient:     Discharge Instructions     You were evaluated in the Emergency Department and after careful evaluation, we did not find any emergent condition requiring admission or further testing in the hospital.  Your exam/testing today is overall reassuring.  Symptoms seem to be due to dehydration. Please drink more fluids at home and follow-up closely with your regular doctor  Please return to the Emergency Department if you experience any worsening of your condition.   Thank you for allowing [place] to be a part of your care.       Sabas Sous, MD 09/10/20 [phone]

## 2020-09-09 NOTE — ED Triage Notes (Signed)
Pt bib [facility] from [facility] c/o ground level fall after smoking outside. Negative LOC. Negative thinners. Assisted living facility states that pt had altered mental status and slurred speech. Pt baseline is a&ox4 and independent with some slurred speech.    BP: 78/42 HR: 55 97% RA CBG: 163

## 2020-09-09 NOTE — ED Notes (Signed)
Trauma downgraded by Dr. Hyacinth Meeker

## 2020-09-10 LAB — COMPREHENSIVE METABOLIC PANEL
ALT: 16 U/L (ref 0–44)
AST: 16 U/L (ref 15–41)
Albumin: 3.3 g/dL — ABNORMAL LOW (ref 3.5–5.0)
Alkaline Phosphatase: 67 U/L (ref 38–126)
Anion gap: 13 (ref 5–15)
BUN: 19 mg/dL (ref 6–20)
CO2: 20 mmol/L — ABNORMAL LOW (ref 22–32)
Calcium: 10.7 mg/dL — ABNORMAL HIGH (ref 8.9–10.3)
Chloride: 103 mmol/L (ref 98–111)
Creatinine, Ser: 1.76 mg/dL — ABNORMAL HIGH (ref 0.61–1.24)
GFR, Estimated: 44 mL/min — ABNORMAL LOW (ref >60)
Glucose, Bld: 124 mg/dL — ABNORMAL HIGH (ref 70–99)
Potassium: 3.9 mmol/L (ref 3.5–5.1)
Sodium: 136 mmol/L (ref 135–145)
Total Bilirubin: 0.7 mg/dL (ref 0.3–1.2)
Total Protein: 6.1 g/dL — ABNORMAL LOW (ref 6.5–8.1)

## 2020-09-10 LAB — TYPE AND SCREEN
ABO/RH(D): A POS
Antibody Screen: NEGATIVE

## 2020-09-10 NOTE — Discharge Instructions (Addendum)
You were evaluated in the Emergency Department and after careful evaluation, we did not find any emergent condition requiring admission or further testing in the hospital.  Your exam/testing today is overall reassuring. Symptoms seem to be due to dehydration. Please drink more fluids at home and follow-up closely with your regular doctor  Please return to the Emergency Department if you experience any worsening of your condition.   Thank you for allowing [place] to be a part of your care.

## 2020-09-10 NOTE — ED Notes (Signed)
Pt ambulated in hall without difficulty. VSS.

## 2020-09-10 NOTE — ED Notes (Signed)
Called [facility] 

## 2020-09-12 LAB — ALDOSTERONE + RENIN ACTIVITY W/ RATIO
ALDO / PRA Ratio: 18.2 (ref 0.0–30.0)
Aldosterone: 16.2 ng/dL (ref 0.0–30.0)
PRA LC/MS/MS: 0.889 ng/mL/hr (ref 0.167–5.380)

## 2022-09-12 IMAGING — CT CT HEAD W/O CM
2 of 4 series · 12 of 47 positions shown, 15 images · non-contrast
Comparison: Head CT 05/09/2012

CLINICAL DATA: Altered mental status.

EXAM:
CT HEAD WITHOUT CONTRAST
TECHNIQUE: Contiguous axial images were obtained from the base of the skull
through the vertex without intravenous contrast.

[Series 5: coronal soft tissue · coronal · 0.33mm/px · 3 of 84 slices shown]
[im 28/84  brain]
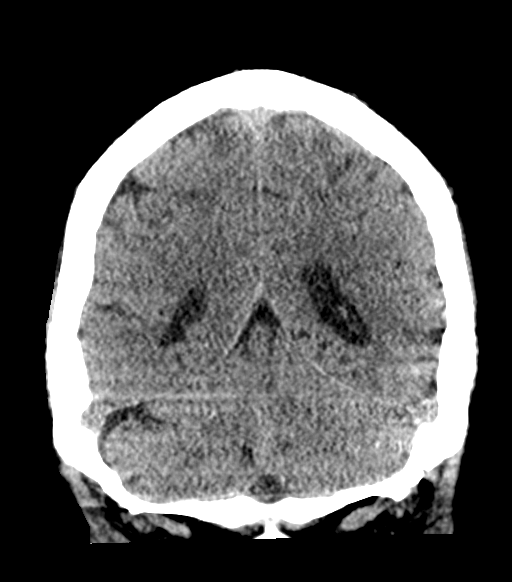
[im 37/84  brain]
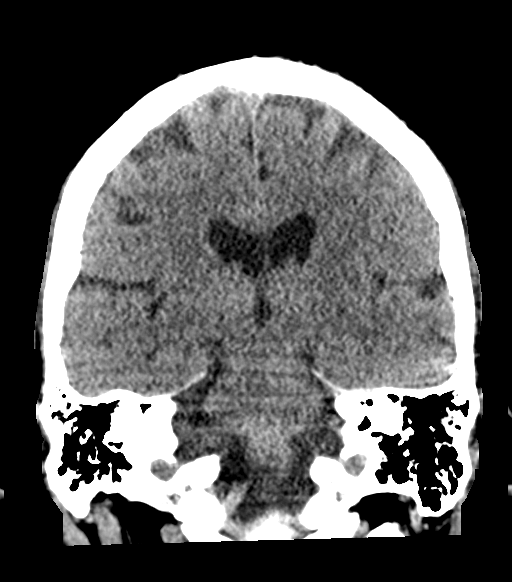
[im 47/84  brain]
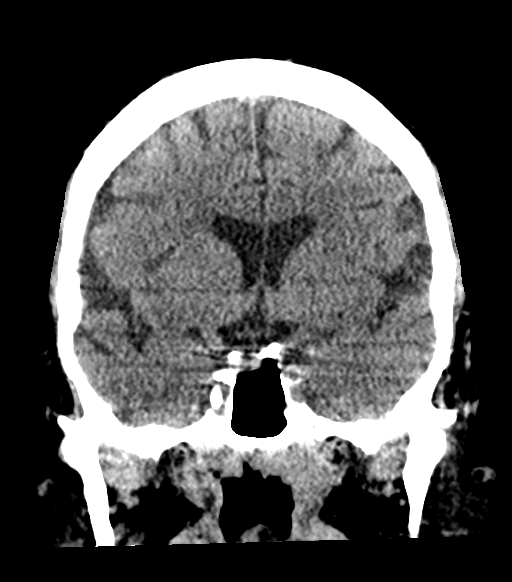

[Series 7: true axial · axial · 0.35mm/px · z∈[-183,-24]mm · 9 of 62 slices shown, 12 images]
[im 4/62  brain]
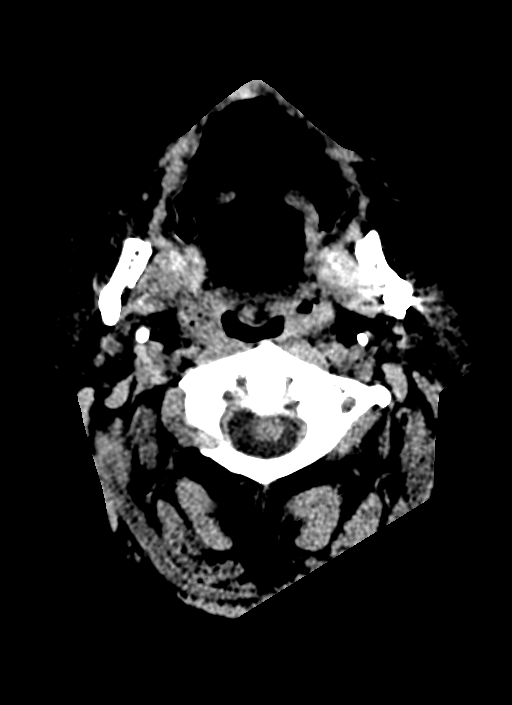
[im 4/62  bone]
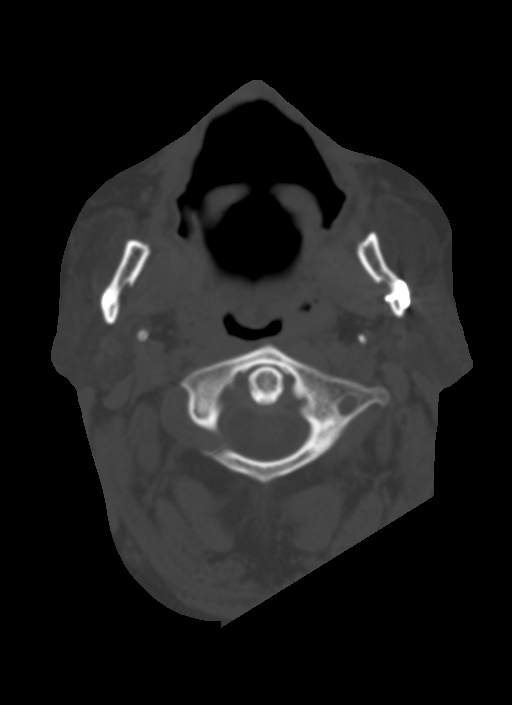
[im 11/62  brain]
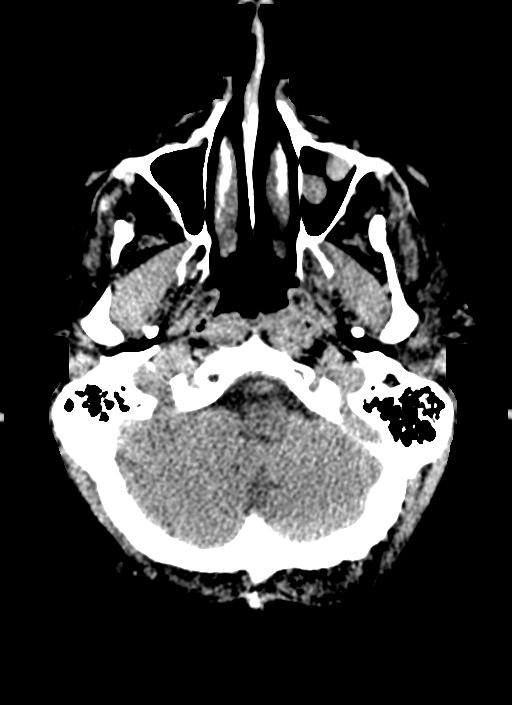
[im 17/62  brain]
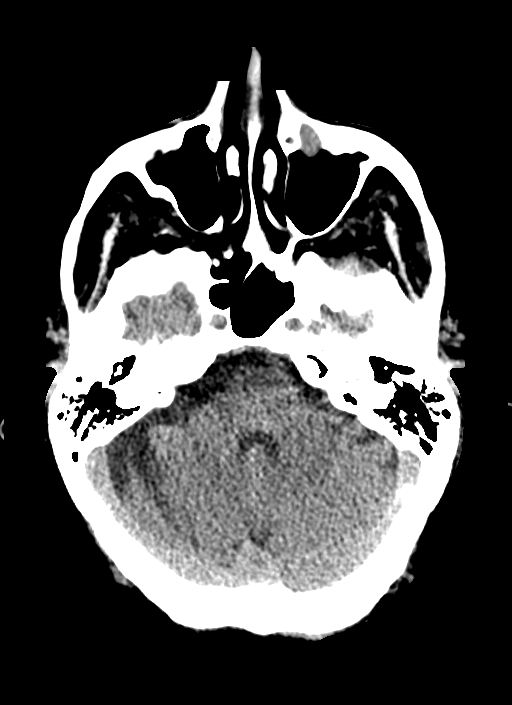
[im 24/62  brain]
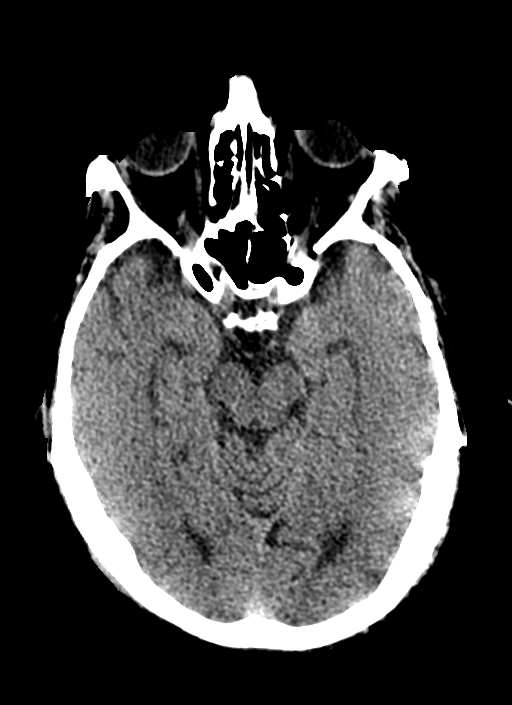
[im 31/62  brain]
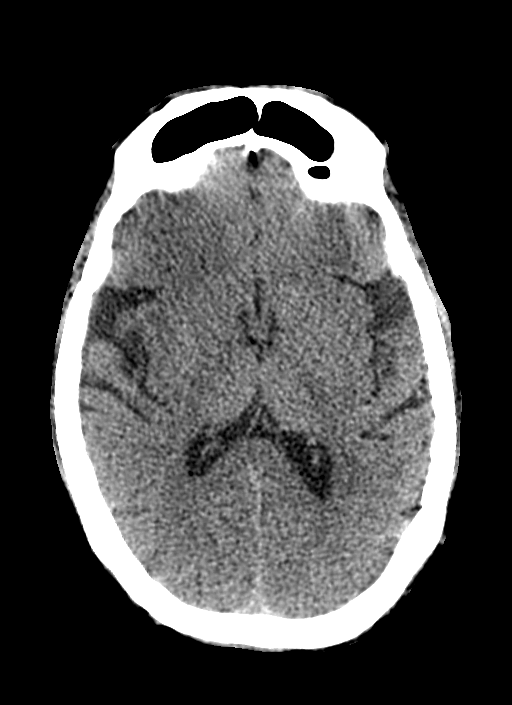
[im 31/62  bone]
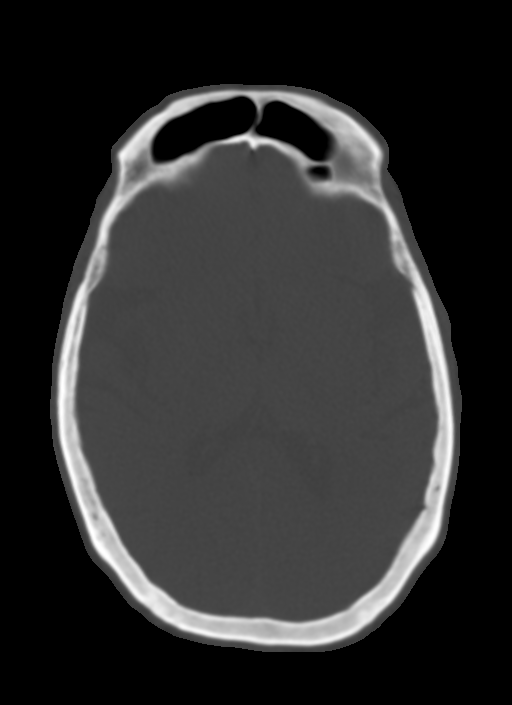
[im 38/62  brain]
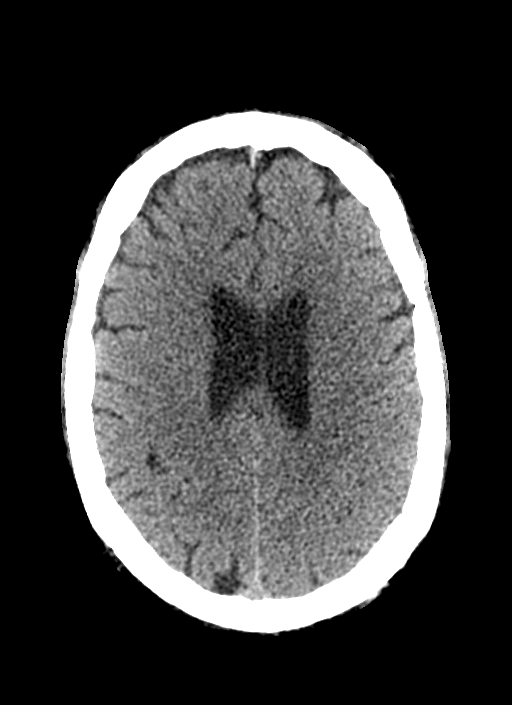
[im 45/62  brain]
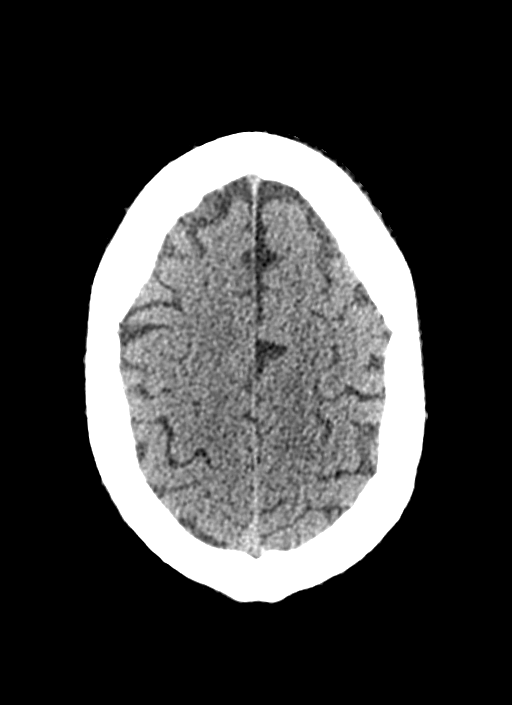
[im 51/62  brain]
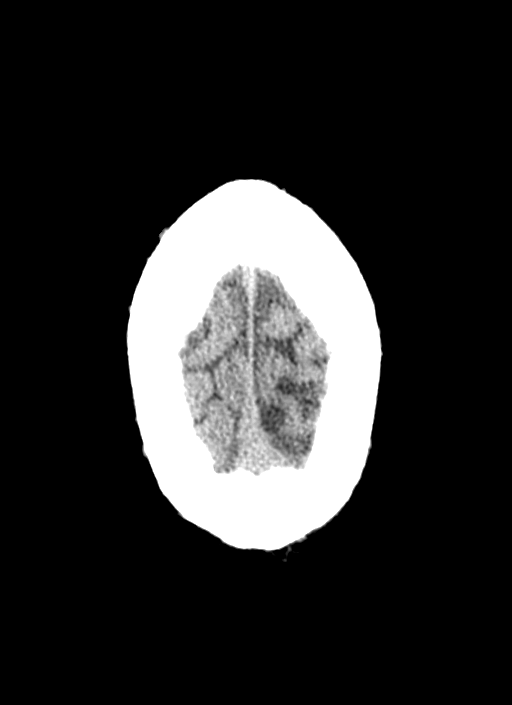
[im 58/62  brain]
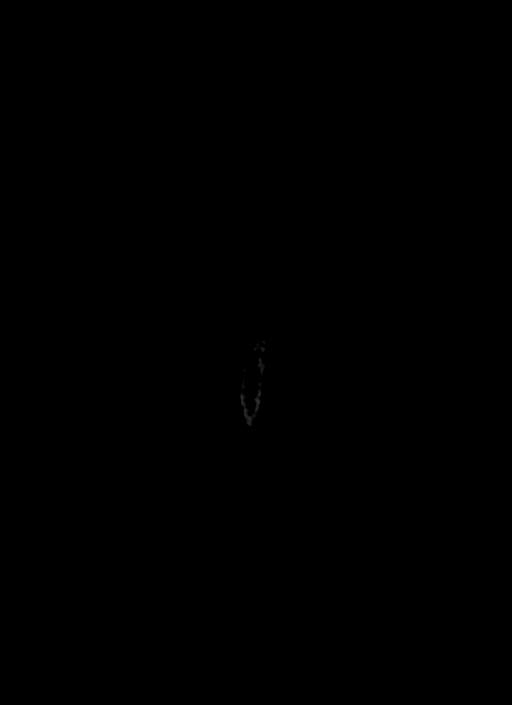
[im 58/62  bone]
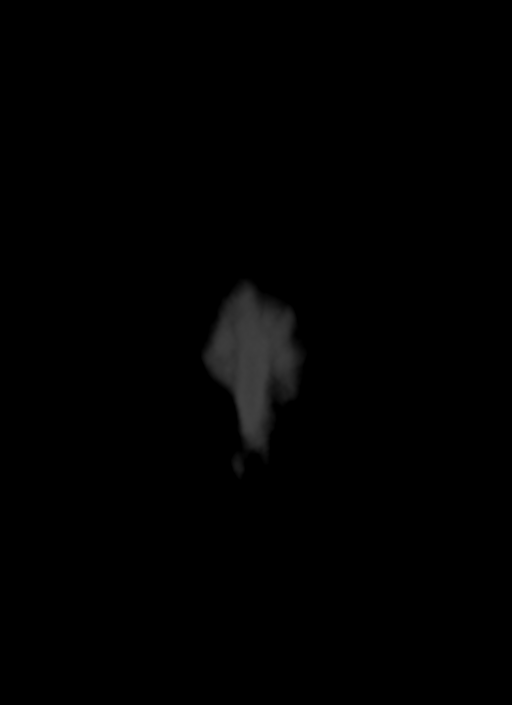

[12 of 47 positions shown; findings below may reference images not displayed]

FINDINGS: Brain: Age related atrophy. There is mild periventricular and deep
white matter hypodensity, nonspecific but typical of chronic small
vessel ischemia. No intracranial hemorrhage, mass effect, or midline
shift. No hydrocephalus. The basilar cisterns are patent. Small
remote medial left occipital infarct unchanged. No evidence of
territorial infarct or acute ischemia. No extra-axial or
intracranial fluid collection.

Vascular: No hyperdense vessel.  Skull base atherosclerosis.

Skull: No fracture or focal lesion.

Sinuses/Orbits: Mucous retention cysts in the left maxillary sinus.
No acute findings. Mastoid air cells are clear.

Other: None.
IMPRESSION: 1. No acute intracranial abnormality.
2. Age related atrophy and chronic small vessel ischemia. Small
remote left occipital infarct.

## 2022-09-18 IMAGING — DX DG CHEST 1V PORT
1 series · 1 of 1 positions shown · non-contrast
Comparison: September 02, 2020

CLINICAL DATA: Shortness of breath, chest congestion

EXAM:
PORTABLE CHEST 1 VIEW

[chest ap]
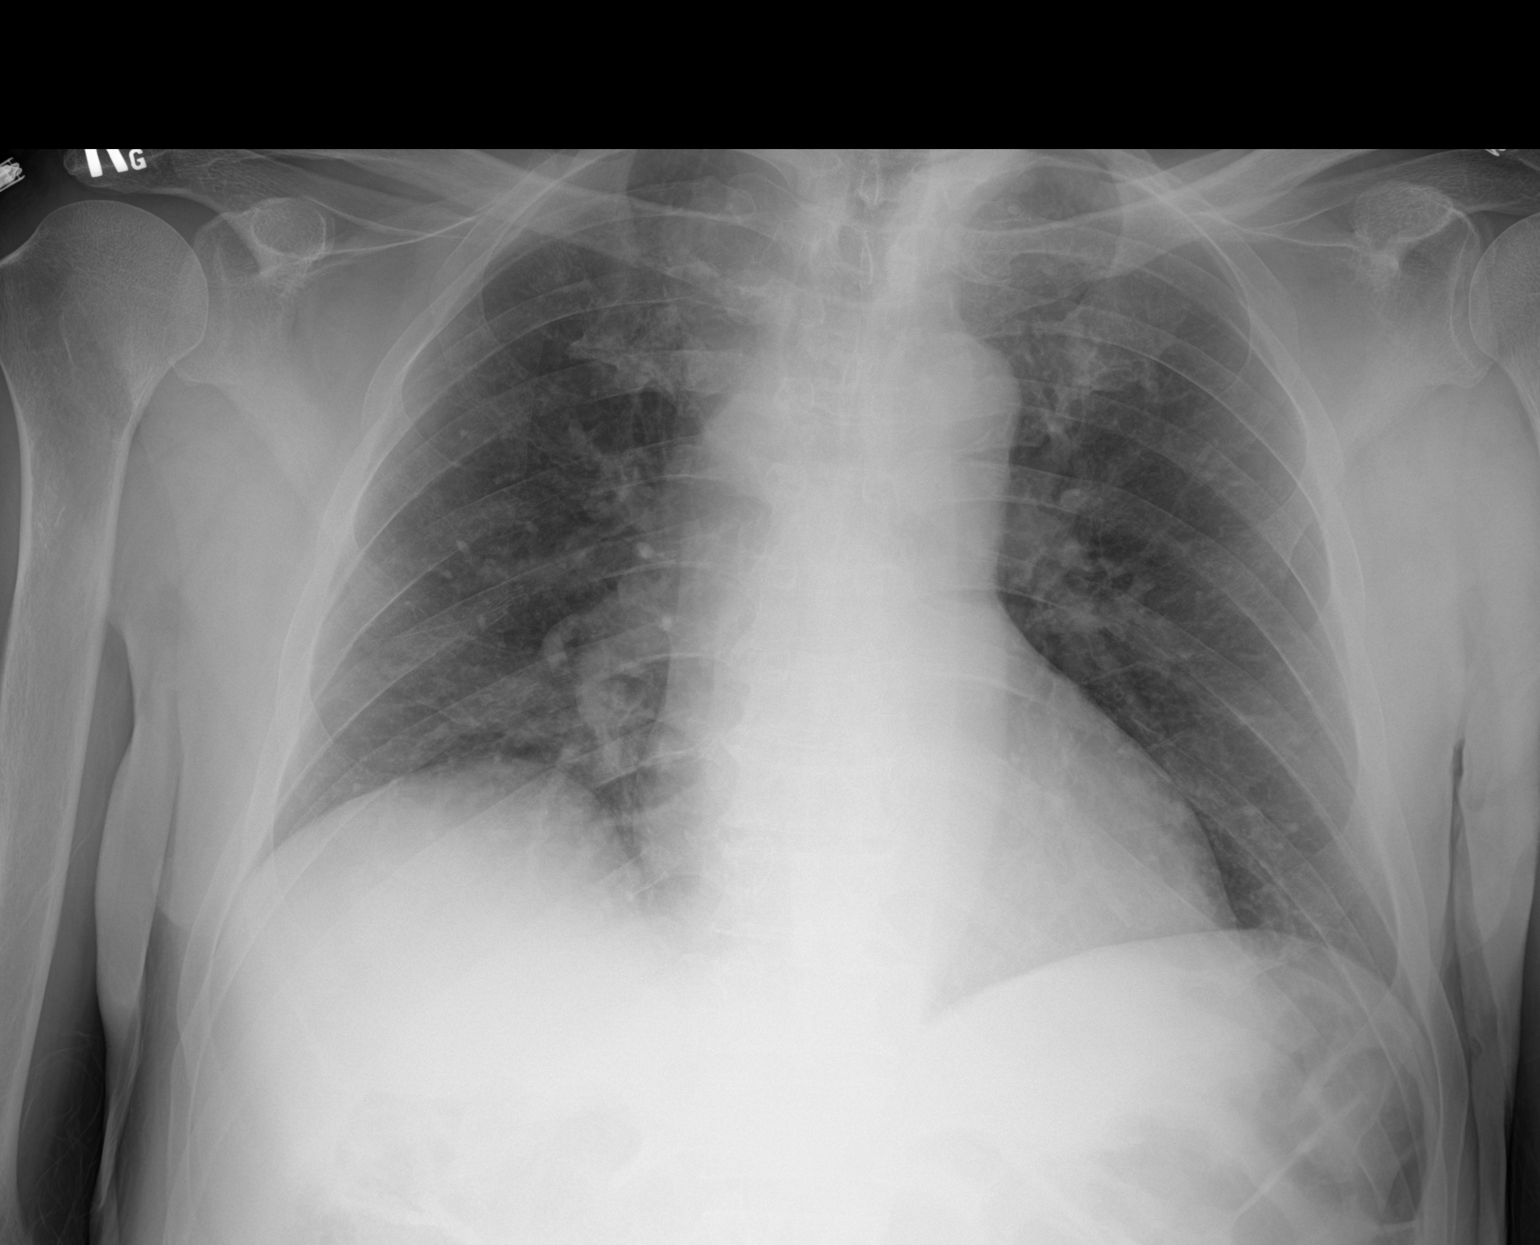

[1 of 1 positions shown; findings below may reference images not displayed]

FINDINGS: Trachea midline. Cardiomediastinal contours and hilar structures are
stable accounting for decreased lung volumes compared to the prior
study, lung volumes are mildly low.

No lobar consolidative changes or sign of pleural effusion.

On limited assessment no acute skeletal process.
IMPRESSION: Mildly low lung volumes, otherwise no acute cardiopulmonary disease.
